# Patient Record
Sex: Female | Born: 1985 | Race: Black or African American | Hispanic: No | Marital: Single | Smoking: Never smoker
Health system: Southern US, Community
[De-identification: ages and names within clinical notes are randomized; demographics above are authoritative.]

## PROBLEM LIST (undated history)

## (undated) ENCOUNTER — Inpatient Hospital Stay (HOSPITAL_COMMUNITY): Payer: Self-pay

## (undated) DIAGNOSIS — Z98891 History of uterine scar from previous surgery: Secondary | ICD-10-CM

## (undated) DIAGNOSIS — J309 Allergic rhinitis, unspecified: Secondary | ICD-10-CM

## (undated) DIAGNOSIS — Z3490 Encounter for supervision of normal pregnancy, unspecified, unspecified trimester: Secondary | ICD-10-CM

## (undated) DIAGNOSIS — O411290 Chorioamnionitis, unspecified trimester, not applicable or unspecified: Secondary | ICD-10-CM

## (undated) DIAGNOSIS — D573 Sickle-cell trait: Secondary | ICD-10-CM

## (undated) DIAGNOSIS — D649 Anemia, unspecified: Secondary | ICD-10-CM

## (undated) HISTORY — DX: Allergic rhinitis, unspecified: J30.9

---

## 2011-12-07 ENCOUNTER — Encounter (HOSPITAL_COMMUNITY): Payer: Self-pay

## 2011-12-07 ENCOUNTER — Emergency Department (INDEPENDENT_AMBULATORY_CARE_PROVIDER_SITE_OTHER)
Admission: EM | Admit: 2011-12-07 | Discharge: 2011-12-07 | Disposition: A | Discharge status: Home / Self Care | Source: Home / Self Care | Attending: Emergency Medicine | Admitting: Emergency Medicine

## 2011-12-07 DIAGNOSIS — L708 Other acne: Secondary | ICD-10-CM

## 2011-12-07 DIAGNOSIS — L709 Acne, unspecified: Secondary | ICD-10-CM

## 2011-12-07 HISTORY — DX: Sickle-cell trait: D57.3

## 2011-12-07 HISTORY — DX: Anemia, unspecified: D64.9

## 2011-12-07 MED ORDER — CLINDAMYCIN PHOS-BENZOYL PEROX 1-5 % EX GEL: Freq: Two times a day (BID) | CUTANEOUS | Status: DC

## 2011-12-07 NOTE — ED Provider Notes (Signed)
History     CSN: [id number]  Arrival date & time 12/07/11  1525   First MD Initiated Contact with Patient 12/07/11 1547      Chief Complaint  Patient presents with  . Allergic Reaction    rash on face    (Consider location/radiation/quality/duration/timing/severity/associated sxs/prior treatment) HPI Comments: Pt with fine, itchy, pustular rash over face starting yesterday. Denies rash anywhere else. Took benadryl with some relief. No known aggravating factors. No new lotions, soaps, detergents, sun exposure, spa treatments, makeup, known contact with poison ivy. No known contacts with similar rash. States she ate some fish yesterday but that it was not different than usual. She does not take any medications on a regular basis. No angioedema, sensation of throat swelling shut, wheezing, SOB, abd pain, N/v/diarrhea.   ROS as noted in HPI. All other ROS negative.   Patient is a 26 y.o. female presenting with allergic reaction.  Allergic Reaction    Past Medical History  Diagnosis Date  . Sickle cell trait   . Anemia     Past Surgical History  Procedure Date  . Cesarean section     History reviewed. No pertinent family history.  History  Substance Use Topics  . Smoking status: Never Smoker   . Smokeless tobacco: Not on file  . Alcohol Use: Yes    OB History    Grav Para Term Preterm Abortions TAB SAB Ect Mult Living                  Review of Systems  Allergies  Shrimp  Home Medications   Current Outpatient Rx  Name Route Sig Dispense Refill  . DIPHENHYDRAMINE HCL 25 MG PO TABS Oral Take 25 mg by mouth every 6 (six) hours as needed.    Marland Kitchen CLINDAMYCIN PHOS-BENZOYL PEROX 1-5 % EX GEL Topical Apply topically 2 (two) times daily. Till symptoms resolve 25 g 2    BP 113/76  Pulse 66  Temp 98.6 F (37 C) (Oral)  Resp 18  SpO2 99%  LMP 11/20/2011  Physical Exam  Nursing note and vitals reviewed. Constitutional: She is oriented to person, place, and time.  She appears well-developed and well-nourished. No distress.  HENT:  Head: Normocephalic and atraumatic.  Mouth/Throat: Uvula is midline, oropharynx is clear and moist and mucous membranes are normal.  Eyes: Conjunctivae and EOM are normal.  Neck: Normal range of motion.  Cardiovascular: Normal rate.   Pulmonary/Chest: Effort normal.  Abdominal: She exhibits no distension.  Musculoskeletal: Normal range of motion.  Neurological: She is alert and oriented to person, place, and time. Coordination normal.  Skin: Skin is warm and dry. Rash noted.       Scattered papules, pustules, comedones of her face. There is no rash over neck, chest, back, abdomen, arms.  Psychiatric: She has a normal mood and affect. Her behavior is normal. Judgment and thought content normal.    ED Course  Procedures (including critical care time)  Labs Reviewed - No data to display No results found.   1. Acne      MDM   Benadryl for itching, and BenzaClin for mild acne. No need for aggressive treatment at this time. Referring to primary care resources for followup as needed    Luiz Blare, MD 12/07/11 2146

## 2011-12-07 NOTE — ED Notes (Signed)
Pt states she developed a rash on her face yesterday with itching, states took benadryl , itching is better but still has rash, denies difficulty breathing but states throat is a little sore.

## 2012-01-19 ENCOUNTER — Inpatient Hospital Stay (HOSPITAL_COMMUNITY)

## 2012-01-19 ENCOUNTER — Inpatient Hospital Stay (HOSPITAL_COMMUNITY)
Admission: AD | Admit: 2012-01-19 | Discharge: 2012-01-19 | Disposition: A | Discharge status: Home / Self Care | Source: Ambulatory Visit | Attending: Family Medicine | Admitting: Family Medicine

## 2012-01-19 ENCOUNTER — Encounter (HOSPITAL_COMMUNITY): Payer: Self-pay

## 2012-01-19 DIAGNOSIS — B373 Candidiasis of vulva and vagina: Secondary | ICD-10-CM

## 2012-01-19 DIAGNOSIS — N949 Unspecified condition associated with female genital organs and menstrual cycle: Secondary | ICD-10-CM | POA: Insufficient documentation

## 2012-01-19 DIAGNOSIS — L293 Anogenital pruritus, unspecified: Secondary | ICD-10-CM | POA: Insufficient documentation

## 2012-01-19 DIAGNOSIS — B3731 Acute candidiasis of vulva and vagina: Secondary | ICD-10-CM

## 2012-01-19 DIAGNOSIS — Z3490 Encounter for supervision of normal pregnancy, unspecified, unspecified trimester: Secondary | ICD-10-CM

## 2012-01-19 LAB — WET PREP, GENITAL: Trich, Wet Prep: NONE SEEN

## 2012-01-19 LAB — URINALYSIS, ROUTINE W REFLEX MICROSCOPIC
Bilirubin Urine: NEGATIVE
Ketones, ur: NEGATIVE mg/dL
Nitrite: NEGATIVE
Protein, ur: NEGATIVE mg/dL
Urobilinogen, UA: 0.2 mg/dL (ref 0.0–1.0)
pH: 7 (ref 5.0–8.0)

## 2012-01-19 LAB — URINE MICROSCOPIC-ADD ON

## 2012-01-19 MED ORDER — MICONAZOLE NITRATE 100-2 MG-% VA KIT: 1.0000 | PACK | Freq: Every day | VAGINAL | Status: DC

## 2012-01-19 NOTE — MAU Provider Note (Signed)
History     CSN: [id number]  Arrival date and time: 01/19/12 1520   First Provider Initiated Contact with Patient 01/19/12 1712      Chief Complaint  Patient presents with  . Possible Pregnancy  . Vaginal Discharge  . Breast Pain   HPI Melissa Joyce is a 26 y.o. female @ [redacted]w[redacted]d gestation who presents to MAU with vaginal discharge, and breast tenderness. Associated symptoms include amenorrhea, nausea and feeling tired. She describes the discharge as mucous. LMP 12/16/11. Last sexual intercourse last night. Last pap smear one year ago and was normal. Current sex partner x 5 years. No history of STI's. The history was provided by the patient.  OB History    Grav Para Term Preterm Abortions TAB SAB Ect Mult Living   4 1 1  2 1 1   1       Past Medical History  Diagnosis Date  . Sickle cell trait   . Anemia     Past Surgical History  Procedure Date  . Cesarean section     Family History  Problem Relation Age of Onset  . Other Neg Hx     History  Substance Use Topics  . Smoking status: Never Smoker   . Smokeless tobacco: Not on file  . Alcohol Use: Yes     wine on ocassion    Allergies:  Allergies  Allergen Reactions  . Shrimp (Shellfish Allergy) Hives    States only "steamed shrimp". Other ways of cooking shrimp are fine.     Prescriptions prior to admission  Medication Sig Dispense Refill  . clindamycin-benzoyl peroxide (BENZACLIN) gel Apply topically 2 (two) times daily. Till symptoms resolve  25 g  2  . diphenhydrAMINE (BENADRYL) 25 MG tablet Take 25 mg by mouth every 6 (six) hours as needed.        Review of Systems  Constitutional: Positive for malaise/fatigue. Negative for fever, chills and weight loss.  HENT: Negative for ear pain, nosebleeds, congestion, sore throat and neck pain.   Eyes: Negative for blurred vision, double vision, photophobia and pain.  Respiratory: Negative for cough, shortness of breath and wheezing.   Cardiovascular: Negative for  chest pain, palpitations and leg swelling.  Gastrointestinal: Positive for nausea. Negative for heartburn, vomiting, abdominal pain, diarrhea and constipation.  Genitourinary: Positive for frequency. Negative for dysuria and urgency.  Musculoskeletal: Positive for back pain. Negative for myalgias.  Skin: Negative for itching and rash.  Neurological: Negative for dizziness, sensory change, speech change, seizures, weakness and headaches.  Endo/Heme/Allergies: Does not bruise/bleed easily.  Psychiatric/Behavioral: Negative for depression. The patient is not nervous/anxious and does not have insomnia.    Physical Exam   Blood pressure 120/59, pulse 80, temperature 99.2 F (37.3 C), temperature source Oral, resp. rate 18, height 5\' 1"  (1.549 m), weight 223 lb 9.6 oz (101.424 kg), last menstrual period 12/16/2011, SpO2 99.00%.  Physical Exam  Constitutional: She is oriented to person, place, and time. She appears well-developed and well-nourished. No distress.  HENT:  Head: Normocephalic and atraumatic.  Eyes: EOM are normal.  Neck: Neck supple.  Cardiovascular: Normal rate.   Respiratory: Effort normal.  GI: Soft. There is no tenderness.  Genitourinary:       External genitalia without lesions. Thick clumpy yellow discharge vaginal vault. Cervix long, closed, no CMT, no adnexal tenderness, uterus without palpable enlargement.   Musculoskeletal: Normal range of motion.  Neurological: She is alert and oriented to person, place, and time.  Skin: Skin is  warm and dry.  Psychiatric: She has a normal mood and affect. Her behavior is normal. Judgment and thought content normal.   Results for orders placed during the hospital encounter of 01/19/12 (from the past 24 hour(s))  HCG, QUANTITATIVE, PREGNANCY     Status: Abnormal   Collection Time   01/19/12  3:33 PM      Component Value Range   hCG, Beta Chain, Quant, S 2852 (*) <5 mIU/mL  URINALYSIS, ROUTINE W REFLEX MICROSCOPIC     Status:  Abnormal   Collection Time   01/19/12  3:45 PM      Component Value Range   Color, Urine YELLOW  YELLOW   APPearance CLEAR  CLEAR   Specific Gravity, Urine 1.020  1.005 - 1.030   pH 7.0  5.0 - 8.0   Glucose, UA NEGATIVE  NEGATIVE mg/dL   Hgb urine dipstick NEGATIVE  NEGATIVE   Bilirubin Urine NEGATIVE  NEGATIVE   Ketones, ur NEGATIVE  NEGATIVE mg/dL   Protein, ur NEGATIVE  NEGATIVE mg/dL   Urobilinogen, UA 0.2  0.0 - 1.0 mg/dL   Nitrite NEGATIVE  NEGATIVE   Leukocytes, UA MODERATE (*) NEGATIVE  URINE MICROSCOPIC-ADD ON     Status: Abnormal   Collection Time   01/19/12  3:45 PM      Component Value Range   Squamous Epithelial / LPF FEW (*) RARE   WBC, UA 3-6  <3 WBC/hpf  WET PREP, GENITAL     Status: Abnormal   Collection Time   01/19/12  5:19 PM      Component Value Range   Yeast Wet Prep HPF POC MODERATE (*) NONE SEEN   Trich, Wet Prep NONE SEEN  NONE SEEN   Clue Cells Wet Prep HPF POC NONE SEEN  NONE SEEN   WBC, Wet Prep HPF POC MANY (*) NONE SEEN   Procedures US Ob Comp Less 14 Wks  01/19/2012  *RADIOLOGY REPORT*  Clinical Data: Early pregnancy with pain.  OBSTETRIC <14 WK [place] AND TRANSVAGINAL OB US  Technique:  Both transabdominal and transvaginal ultrasound examinations were performed for complete evaluation of the gestation as well as the maternal uterus, adnexal regions, and pelvic cul-de-sac.  Transvaginal technique was performed to assess early pregnancy.  Comparison:  None.  Intrauterine gestational sac:  0.54 cm gestational sac Yolk sac: Yes Embryo: No Cardiac Activity: No  MSD: 0.54 cm mm  5 w 1 d       [place] EDC: 09/19/2012  Maternal uterus/adnexae: There is no subchorionic hemorrhage.  Ovaries appear normal with a small corpus luteum cyst on the right ovary.  IMPRESSION: Single intrauterine pregnancy of approximately 5 weeks 1 day gestation.  No visualized embryo or cardiac activity at this time.   Original Report Authenticated By: Gwynn Burly, M.D.    US Ob  Transvaginal  01/19/2012  *RADIOLOGY REPORT*  Clinical Data: Early pregnancy with pain.  OBSTETRIC <14 WK [place] AND TRANSVAGINAL OB US  Technique:  Both transabdominal and transvaginal ultrasound examinations were performed for complete evaluation of the gestation as well as the maternal uterus, adnexal regions, and pelvic cul-de-sac.  Transvaginal technique was performed to assess early pregnancy.  Comparison:  None.  Intrauterine gestational sac:  0.54 cm gestational sac Yolk sac: Yes Embryo: No Cardiac Activity: No  MSD: 0.54 cm mm  5 w 1 d       [place] EDC: 09/19/2012  Maternal uterus/adnexae: There is no subchorionic hemorrhage.  Ovaries appear normal with a small  corpus luteum cyst on the right ovary.  IMPRESSION: Single intrauterine pregnancy of approximately 5 weeks 1 day gestation.  No visualized embryo or cardiac activity at this time.   Original Report Authenticated By: Gwynn Burly, M.D.     Assessment: 26 y.o. female @ 5w 1d gestation with monilia vaginosis   Right CLC   Plan:  Treat yeast   Start prenatal care and prenatal vitamins, return as needed  Discussed with the patient and all questioned fully answered. She will return if any problems arise.      Medication List     As of 01/20/2012  3:35 PM    START taking these medications         Miconazole Nitrate 100-2 MG-% Kit   Place 1 Applicatorful vaginally at bedtime.      CONTINUE taking these medications         diphenhydrAMINE 25 MG tablet   Commonly known as: BENADRYL      STOP taking these medications         clindamycin-benzoyl peroxide gel   Commonly known as: BENZACLIN          Where to get your medications    These are the prescriptions that you need to pick up.   You may get these medications from any pharmacy.         Miconazole Nitrate 100-2 MG-% Kit           Eunique Balik, RN, FNP, [facility] 01/19/2012, 6:02 PM

## 2012-01-19 NOTE — MAU Note (Signed)
Patient states she has had a yellow vaginal discharge with itching for 2 days. Has had breast tenderness for about one week. Late period.

## 2012-01-20 LAB — GC/CHLAMYDIA PROBE AMP, GENITAL
Chlamydia, DNA Probe: NEGATIVE
GC Probe Amp, Genital: NEGATIVE

## 2012-01-21 NOTE — MAU Provider Note (Signed)
Chart reviewed and agree with management and plan.  

## 2012-02-11 LAB — OB RESULTS CONSOLE ABO/RH: RH Type: POSITIVE

## 2012-02-11 LAB — OB RESULTS CONSOLE RPR: RPR: NONREACTIVE

## 2012-02-11 LAB — OB RESULTS CONSOLE HIV ANTIBODY (ROUTINE TESTING): HIV: NONREACTIVE

## 2012-02-11 LAB — OB RESULTS CONSOLE HEPATITIS B SURFACE ANTIGEN: Hepatitis B Surface Ag: NEGATIVE

## 2012-03-30 ENCOUNTER — Inpatient Hospital Stay (HOSPITAL_COMMUNITY)
Admission: AD | Admit: 2012-03-30 | Discharge: 2012-03-30 | Disposition: A | Discharge status: Home / Self Care | Source: Ambulatory Visit | Attending: Obstetrics | Admitting: Obstetrics

## 2012-03-30 ENCOUNTER — Encounter (HOSPITAL_COMMUNITY): Payer: Self-pay | Admitting: *Deleted

## 2012-03-30 DIAGNOSIS — O99891 Other specified diseases and conditions complicating pregnancy: Secondary | ICD-10-CM | POA: Insufficient documentation

## 2012-03-30 DIAGNOSIS — R109 Unspecified abdominal pain: Secondary | ICD-10-CM | POA: Insufficient documentation

## 2012-03-30 LAB — URINALYSIS, ROUTINE W REFLEX MICROSCOPIC
Bilirubin Urine: NEGATIVE
Ketones, ur: NEGATIVE mg/dL
Nitrite: NEGATIVE
Specific Gravity, Urine: 1.02 (ref 1.005–1.030)
Urobilinogen, UA: 0.2 mg/dL (ref 0.0–1.0)

## 2012-03-30 LAB — CBC
MCH: 26.3 pg (ref 26.0–34.0)
MCV: 73.9 fL — ABNORMAL LOW (ref 78.0–100.0)
Platelets: 248 10*3/uL (ref 150–400)
RDW: 16.7 % — ABNORMAL HIGH (ref 11.5–15.5)
WBC: 13.2 10*3/uL — ABNORMAL HIGH (ref 4.0–10.5)

## 2012-03-30 NOTE — MAU Provider Note (Signed)
  History     CSN: [id number]  Arrival date and time: 03/30/12 1013   First Provider Initiated Contact with Patient 03/30/12 1057      Chief Complaint  Patient presents with  . Abdominal Pain   HPI   Pt is G4P1021 [redacted]w[redacted]d who presents with bilateral lower abdominal pain since last night about 11:30pm.  The pain lasted all night.  The pain comes and goes.  Pt denies pain with urination, constipation, diarrhea, fever, chills, nausea or vomiting.  She last ate 11 pm and has not had anything to drink since last night.  She denies spotting, bleeding or loss of fluid or vaginal discharge. She has not taken anything for the pian.  Pt did not call the provider before coming the the hospital. Pt is a pt of Dr. Cherly Hensen- saw Oct 14 and has an appointment next Tues.  She has not had any problems with this pregnancy.  Past Medical History  Diagnosis Date  . Sickle cell trait   . Anemia     Past Surgical History  Procedure Date  . Cesarean section     Family History  Problem Relation Age of Onset  . Other Neg Hx     History  Substance Use Topics  . Smoking status: Never Smoker   . Smokeless tobacco: Not on file  . Alcohol Use: Yes     Comment: wine on ocassion    Allergies:  Allergies  Allergen Reactions  . Shrimp (Shellfish Allergy) Hives    States only "steamed shrimp". Other ways of cooking shrimp are fine.     Prescriptions prior to admission  Medication Sig Dispense Refill  . IRON PO Take 1 tablet by mouth every morning.      . Prenatal Vit-Fe Fumarate-FA (PRENATAL MULTIVITAMIN) TABS Take 1 tablet by mouth daily.        ROS Physical Exam   Blood pressure 124/61, pulse 76, temperature 98.2 F (36.8 C), temperature source Oral, resp. rate 20, height 5' 1.5" (1.562 m), weight 234 lb 6.4 oz (106.323 kg), last menstrual period 12/16/2011.  Physical Exam  MAU Course  Procedures     Assessment and Plan    Nadir Vasques 03/30/2012, 10:57 AM

## 2012-03-30 NOTE — H&P (Signed)
CC: abdominal pain  HPI: 26 yo G4 P1021 at weeks presents with bilateral lower quadrant pain right greater than left since last night at 11 PM. Patient stated last night pain was cyclic coming and going. Pain improved overnight and patient was able to sleep however she did wake up a couple times with twinges of pain. Upon awaking this morning the pain returned and patient decided to present to MAU for evaluation. Patient notes no nausea, tolerating regular food. Patient notes normal bowel movement yesterday with no constipation. Patient notes no vaginal bleeding. Patient does have a prior cesarean section. Patient does have a LEEP after her last delivery. Patient notes no increase in vaginal discharge. Patient notes no dyspareunia.   patient mostly concerned about the viability of the pregnancy.  Past medical history: Denies Past surgical history: Cesarean section, LEEP Medications: Prenatal vitamin Allergies: No known drug allergies  Physical exam Filed Vitals:   03/30/12 1019 03/30/12 1028  BP:  124/61  Pulse:  76  Temp:  98.2 F (36.8 C)  TempSrc:  Oral  Resp:  20  Height: 5' 1.5" (1.562 m)   Weight: 106.323 kg (234 lb 6.4 oz)    General: Well-appearing, in no distress Cardiovascular: Regular rate and rhythm, no murmurs Pulmonary: Clear to auscultation bilaterally Abdomen: Obese, no right upper quadrant pain, soft, no distention, no rebound, no guarding, nonsurgical abdomen, minimal tenderness in right lower cautery deep palpation, no masses palpated however exam limited secondary to patient's body habitus GU: Normal vagina no blood, no discharge, cervix with scar from prior LEEP, long and closed, no cervical motion tenderness, no specific fundal tenderness although fundus is difficult to discern due to body habitus, right lower quadrant tenderness noted, though this is minimal. Nonsurgical. No left lower cautery tenderness, no masses Lower extremities: Nontender, no edema  CBC      Component Value Date/Time   WBC 13.2* 03/30/2012 1055   RBC 4.37 03/30/2012 1055   HGB 11.5* 03/30/2012 1055   HCT 32.3* 03/30/2012 1055   PLT 248 03/30/2012 1055   MCV 73.9* 03/30/2012 1055   MCH 26.3 03/30/2012 1055   MCHC 35.6 03/30/2012 1055   RDW 16.7* 03/30/2012 1055     Urinalysis: Normal Gonorrhea and chlamydia: Pending Urine culture: Pending Wet prep: No yeast, no BV, few white blood cells  Assessment and plan: 26 year old G4 P1 0-1 at [redacted] weeks pregnant with right lower cautery pain of unclear etiology with overall normal vital signs, normal labs, and benign exam. Active fetal heart. The patient has history of LEEP no evidence of cervical incompetence. Discussed with patient most worrisome diagnoses are cervical incompetence, preterm labor, appendicitis and she does not show any symptoms of any of these. We discussed more common diagnoses include constipation and recommended an aggressive bowel regimen. We also discussed round ligament pain possibly worsened by her prior surgical history. At this point patient has no concerning factors for inpatient admission or urgent surgical intervention. We'll keep her planned followup outpatient appointment in one week. Recommends Tylenol, bowel regimen, heat as needed for discomfort.  Aleric Froelich A. 03/30/2012 12:41 PM

## 2012-03-30 NOTE — MAU Note (Signed)
RLQ pain since last night, sharp, intermittent.  Denies N/V/D.  No vaginal bleeding.  Pain is worse with movement & walking.

## 2012-03-31 LAB — GC/CHLAMYDIA PROBE AMP, GENITAL: Chlamydia, DNA Probe: NEGATIVE

## 2012-07-31 ENCOUNTER — Encounter (HOSPITAL_COMMUNITY): Payer: Self-pay | Admitting: *Deleted

## 2012-07-31 ENCOUNTER — Inpatient Hospital Stay (HOSPITAL_COMMUNITY)
Admission: AD | Admit: 2012-07-31 | Discharge: 2012-07-31 | Disposition: A | Discharge status: Home / Self Care | Source: Ambulatory Visit | Attending: Obstetrics and Gynecology | Admitting: Obstetrics and Gynecology

## 2012-07-31 DIAGNOSIS — O479 False labor, unspecified: Secondary | ICD-10-CM

## 2012-07-31 DIAGNOSIS — D573 Sickle-cell trait: Secondary | ICD-10-CM | POA: Insufficient documentation

## 2012-07-31 DIAGNOSIS — O4700 False labor before 37 completed weeks of gestation, unspecified trimester: Secondary | ICD-10-CM | POA: Insufficient documentation

## 2012-07-31 DIAGNOSIS — O99019 Anemia complicating pregnancy, unspecified trimester: Secondary | ICD-10-CM | POA: Insufficient documentation

## 2012-07-31 NOTE — MAU Note (Signed)
PT SAYS  IS HAVING UC Q 10 MIN.- STARTED AT 4PM.   DID NOT CALL HER DR.   DENIES HSV AND MRSA.  LAST SEX- THIS AM.

## 2012-07-31 NOTE — MAU Provider Note (Signed)
  History     CSN: [id number]  Arrival date and time: 07/31/12 1743  No chief complaint on file.  HPI Contractions every 10-15 minutes since having sex this am 0400 No bleeding - no spotting - no discharge   Past Medical History  Diagnosis Date  . Sickle cell trait   . Anemia     Past Surgical History  Procedure Laterality Date  . Cesarean section      Family History  Problem Relation Age of Onset  . Other Neg Hx     History  Substance Use Topics  . Smoking status: Never Smoker   . Smokeless tobacco: Not on file  . Alcohol Use: Yes     Comment: wine on ocassion    Allergies:  Allergies  Allergen Reactions  . Shrimp (Shellfish Allergy) Hives    States only "steamed shrimp". Other ways of cooking shrimp are fine.     Prescriptions prior to admission  Medication Sig Dispense Refill  . IRON PO Take 1 tablet by mouth every morning.      . Prenatal Vit-Fe Fumarate-FA (PRENATAL MULTIVITAMIN) TABS Take 1 tablet by mouth daily.        ROS Physical Exam   Blood pressure 121/72, pulse 88, temperature 98.1 F (36.7 C), temperature source Oral, resp. rate 20, height 5\' 1"  (1.549 m), weight 107.956 kg (238 lb), last menstrual period 12/16/2011.  Physical Exam Alert and oriented Abdomen soft and non-tender VE: closed / thick / vtx out of pelvis - ballotable extremities - no edema  MAU Course  Procedures  FHR category 1 UI with occasional ctx  Assessment and Plan  32 weeks - braxton-hicks ctx s/p intercourse No evidence of PTL  1) discharge to home 2) pelvic rest - no sex until next ROB visit (Wednesday) 3) rest - hydration - tub soak before HS tonight 4) ok benadryl 25 mg for sleep   Marlinda Mike 07/31/2012, 6:33 PM

## 2012-07-31 NOTE — MAU Note (Signed)
Wiliam Ke CNM at the bedside.

## 2012-09-19 ENCOUNTER — Inpatient Hospital Stay (HOSPITAL_COMMUNITY)
Admission: AD | Admit: 2012-09-19 | Discharge: 2012-09-20 | Disposition: A | Discharge status: Home / Self Care | Source: Ambulatory Visit | Attending: Obstetrics and Gynecology | Admitting: Obstetrics and Gynecology

## 2012-09-19 ENCOUNTER — Encounter (HOSPITAL_COMMUNITY): Payer: Self-pay | Admitting: *Deleted

## 2012-09-19 DIAGNOSIS — O479 False labor, unspecified: Secondary | ICD-10-CM | POA: Insufficient documentation

## 2012-09-19 NOTE — MAU Note (Signed)
Pt states contractions are closer and harder 

## 2012-09-21 ENCOUNTER — Other Ambulatory Visit: Payer: Self-pay | Admitting: Obstetrics and Gynecology

## 2012-09-22 ENCOUNTER — Encounter (HOSPITAL_COMMUNITY): Payer: Self-pay | Admitting: Anesthesiology

## 2012-09-22 ENCOUNTER — Encounter (HOSPITAL_COMMUNITY)
Admission: AD | Disposition: A | Discharge status: Home / Self Care | Payer: Self-pay | Source: Ambulatory Visit | Attending: Obstetrics and Gynecology

## 2012-09-22 ENCOUNTER — Inpatient Hospital Stay (HOSPITAL_COMMUNITY)
Admission: AD | Admit: 2012-09-22 | Discharge: 2012-09-25 | DRG: 765 | Disposition: A | Discharge status: Home / Self Care | Source: Ambulatory Visit | Attending: Obstetrics and Gynecology | Admitting: Obstetrics and Gynecology

## 2012-09-22 ENCOUNTER — Encounter (HOSPITAL_COMMUNITY): Payer: Self-pay | Admitting: *Deleted

## 2012-09-22 ENCOUNTER — Inpatient Hospital Stay (HOSPITAL_COMMUNITY): Admitting: Anesthesiology

## 2012-09-22 DIAGNOSIS — D62 Acute posthemorrhagic anemia: Secondary | ICD-10-CM | POA: Diagnosis not present

## 2012-09-22 DIAGNOSIS — O9903 Anemia complicating the puerperium: Secondary | ICD-10-CM | POA: Diagnosis not present

## 2012-09-22 DIAGNOSIS — O34219 Maternal care for unspecified type scar from previous cesarean delivery: Principal | ICD-10-CM | POA: Diagnosis present

## 2012-09-22 DIAGNOSIS — O324XX0 Maternal care for high head at term, not applicable or unspecified: Secondary | ICD-10-CM | POA: Diagnosis present

## 2012-09-22 DIAGNOSIS — O99892 Other specified diseases and conditions complicating childbirth: Secondary | ICD-10-CM | POA: Diagnosis present

## 2012-09-22 DIAGNOSIS — Z22330 Carrier of Group B streptococcus: Secondary | ICD-10-CM

## 2012-09-22 DIAGNOSIS — O411090 Infection of amniotic sac and membranes, unspecified, unspecified trimester, not applicable or unspecified: Secondary | ICD-10-CM | POA: Diagnosis present

## 2012-09-22 DIAGNOSIS — O328XX0 Maternal care for other malpresentation of fetus, not applicable or unspecified: Secondary | ICD-10-CM | POA: Diagnosis present

## 2012-09-22 DIAGNOSIS — O411290 Chorioamnionitis, unspecified trimester, not applicable or unspecified: Secondary | ICD-10-CM

## 2012-09-22 DIAGNOSIS — Z98891 History of uterine scar from previous surgery: Secondary | ICD-10-CM

## 2012-09-22 HISTORY — DX: Chorioamnionitis, unspecified trimester, not applicable or unspecified: O41.1290

## 2012-09-22 HISTORY — DX: History of uterine scar from previous surgery: Z98.891

## 2012-09-22 LAB — CBC
HCT: 33.3 % — ABNORMAL LOW (ref 36.0–46.0)
MCH: 25.7 pg — ABNORMAL LOW (ref 26.0–34.0)
MCHC: 34.8 g/dL (ref 30.0–36.0)
MCV: 73.7 fL — ABNORMAL LOW (ref 78.0–100.0)
RDW: 16.4 % — ABNORMAL HIGH (ref 11.5–15.5)

## 2012-09-22 LAB — TYPE AND SCREEN
ABO/RH(D): O POS
Antibody Screen: NEGATIVE

## 2012-09-22 SURGERY — Surgical Case
Anesthesia: Epidural | Site: Abdomen | Wound class: Clean Contaminated

## 2012-09-22 MED ORDER — PHENYLEPHRINE 40 MCG/ML (10ML) SYRINGE FOR IV PUSH (FOR BLOOD PRESSURE SUPPORT): 80.0000 ug | PREFILLED_SYRINGE | INTRAVENOUS | Status: DC | PRN

## 2012-09-22 MED ORDER — ONDANSETRON HCL 4 MG/2ML IJ SOLN: 4.0000 mg | Freq: Four times a day (QID) | INTRAMUSCULAR | Status: DC | PRN

## 2012-09-22 MED ORDER — SODIUM CHLORIDE 0.9 % IJ SOLN
3.0000 mL | Freq: Two times a day (BID) | INTRAMUSCULAR | Status: DC
Start: 2012-09-23 — End: 2012-09-25

## 2012-09-22 MED ORDER — OXYTOCIN 40 UNITS IN LACTATED RINGERS INFUSION - SIMPLE MED
62.5000 mL/h | INTRAVENOUS | Status: DC
  Filled 2012-09-22: qty 1000

## 2012-09-22 MED ORDER — IBUPROFEN 600 MG PO TABS
600.0000 mg | ORAL_TABLET | Freq: Four times a day (QID) | ORAL | Status: DC
  Administered 2012-09-23 – 2012-09-25 (×9): 600 mg via ORAL
  Filled 2012-09-22 (×9): qty 1

## 2012-09-22 MED ORDER — SIMETHICONE 80 MG PO CHEW: 80.0000 mg | CHEWABLE_TABLET | ORAL | Status: DC | PRN

## 2012-09-22 MED ORDER — OXYTOCIN 40 UNITS IN LACTATED RINGERS INFUSION - SIMPLE MED: 62.5000 mL/h | INTRAVENOUS | Status: AC

## 2012-09-22 MED ORDER — FERROUS SULFATE 325 (65 FE) MG PO TABS
325.0000 mg | ORAL_TABLET | Freq: Two times a day (BID) | ORAL | Status: DC
  Administered 2012-09-23 – 2012-09-25 (×5): 325 mg via ORAL
  Filled 2012-09-22 (×4): qty 1

## 2012-09-22 MED ORDER — METHYLERGONOVINE MALEATE 0.2 MG PO TABS: 0.2000 mg | ORAL_TABLET | ORAL | Status: DC | PRN

## 2012-09-22 MED ORDER — PENICILLIN G POTASSIUM 5000000 UNITS IJ SOLR
5.0000 10*6.[IU] | Freq: Once | INTRAMUSCULAR | Status: AC
  Administered 2012-09-22: 5 10*6.[IU] via INTRAVENOUS
  Filled 2012-09-22: qty 5

## 2012-09-22 MED ORDER — SIMETHICONE 80 MG PO CHEW
80.0000 mg | CHEWABLE_TABLET | Freq: Three times a day (TID) | ORAL | Status: DC
  Administered 2012-09-23 – 2012-09-25 (×8): 80 mg via ORAL

## 2012-09-22 MED ORDER — LIDOCAINE HCL (PF) 1 % IJ SOLN: 30.0000 mL | INTRAMUSCULAR | Status: DC | PRN

## 2012-09-22 MED ORDER — BUPIVACAINE HCL (PF) 0.25 % IJ SOLN
INTRAMUSCULAR | Status: DC | PRN
  Administered 2012-09-22: 6 mL

## 2012-09-22 MED ORDER — EPHEDRINE 5 MG/ML INJ
10.0000 mg | INTRAVENOUS | Status: DC | PRN
  Filled 2012-09-22: qty 4

## 2012-09-22 MED ORDER — FLEET ENEMA 7-19 GM/118ML RE ENEM: 1.0000 | ENEMA | Freq: Every day | RECTAL | Status: DC | PRN

## 2012-09-22 MED ORDER — MEPERIDINE HCL 25 MG/ML IJ SOLN: 6.2500 mg | INTRAMUSCULAR | Status: DC | PRN

## 2012-09-22 MED ORDER — LANOLIN HYDROUS EX OINT: 1.0000 "application " | TOPICAL_OINTMENT | CUTANEOUS | Status: DC | PRN

## 2012-09-22 MED ORDER — NALOXONE HCL 1 MG/ML IJ SOLN: 1.0000 ug/kg/h | INTRAVENOUS | Status: DC | PRN

## 2012-09-22 MED ORDER — ACETAMINOPHEN 500 MG PO TABS: 1000.0000 mg | ORAL_TABLET | ORAL | Status: DC | PRN

## 2012-09-22 MED ORDER — PHENYLEPHRINE 40 MCG/ML (10ML) SYRINGE FOR IV PUSH (FOR BLOOD PRESSURE SUPPORT)
80.0000 ug | PREFILLED_SYRINGE | INTRAVENOUS | Status: DC | PRN
  Filled 2012-09-22 (×2): qty 5

## 2012-09-22 MED ORDER — PRENATAL MULTIVITAMIN CH
1.0000 | ORAL_TABLET | Freq: Every day | ORAL | Status: DC
  Filled 2012-09-22 (×2): qty 1

## 2012-09-22 MED ORDER — LIDOCAINE-EPINEPHRINE (PF) 2 %-1:200000 IJ SOLN
INTRAMUSCULAR | Status: AC
  Filled 2012-09-22: qty 20

## 2012-09-22 MED ORDER — GENTAMICIN SULFATE 40 MG/ML IJ SOLN
Freq: Once | INTRAVENOUS | Status: AC
  Administered 2012-09-22: 115.25 mL via INTRAVENOUS
  Filled 2012-09-22: qty 9.25

## 2012-09-22 MED ORDER — DIPHENHYDRAMINE HCL 25 MG PO CAPS
25.0000 mg | ORAL_CAPSULE | ORAL | Status: DC | PRN
  Administered 2012-09-23: 25 mg via ORAL
  Filled 2012-09-22: qty 1

## 2012-09-22 MED ORDER — OXYCODONE-ACETAMINOPHEN 5-325 MG PO TABS
1.0000 | ORAL_TABLET | ORAL | Status: DC | PRN
  Administered 2012-09-23: 2 via ORAL
  Administered 2012-09-24 – 2012-09-25 (×5): 1 via ORAL
  Filled 2012-09-22 (×3): qty 1
  Filled 2012-09-22: qty 2
  Filled 2012-09-22 (×2): qty 1

## 2012-09-22 MED ORDER — SENNOSIDES-DOCUSATE SODIUM 8.6-50 MG PO TABS
2.0000 | ORAL_TABLET | Freq: Every day | ORAL | Status: DC
Start: 2012-09-22 — End: 2012-09-25
  Administered 2012-09-23 – 2012-09-24 (×2): 2 via ORAL

## 2012-09-22 MED ORDER — LACTATED RINGERS IV SOLN
500.0000 mL | INTRAVENOUS | Status: DC | PRN
  Administered 2012-09-22 (×2): 500 mL via INTRAVENOUS

## 2012-09-22 MED ORDER — LACTATED RINGERS IV SOLN
INTRAVENOUS | Status: DC
  Administered 2012-09-22: 125 mL/h via INTRAVENOUS
  Administered 2012-09-22 (×3): via INTRAVENOUS

## 2012-09-22 MED ORDER — METHYLERGONOVINE MALEATE 0.2 MG/ML IJ SOLN: 0.2000 mg | INTRAMUSCULAR | Status: DC | PRN

## 2012-09-22 MED ORDER — ONDANSETRON HCL 4 MG/2ML IJ SOLN
INTRAMUSCULAR | Status: DC | PRN
  Administered 2012-09-22: 4 mg via INTRAVENOUS

## 2012-09-22 MED ORDER — CLINDAMYCIN PHOSPHATE 900 MG/50ML IV SOLN
900.0000 mg | Freq: Three times a day (TID) | INTRAVENOUS | Status: DC
  Administered 2012-09-23 (×2): 900 mg via INTRAVENOUS
  Filled 2012-09-22 (×4): qty 50

## 2012-09-22 MED ORDER — SODIUM BICARBONATE 8.4 % IV SOLN
INTRAVENOUS | Status: AC
  Filled 2012-09-22: qty 50

## 2012-09-22 MED ORDER — CITRIC ACID-SODIUM CITRATE 334-500 MG/5ML PO SOLN
30.0000 mL | ORAL | Status: DC | PRN
  Filled 2012-09-22: qty 15

## 2012-09-22 MED ORDER — FLEET ENEMA 7-19 GM/118ML RE ENEM: 1.0000 | ENEMA | RECTAL | Status: DC | PRN

## 2012-09-22 MED ORDER — DIPHENHYDRAMINE HCL 50 MG/ML IJ SOLN: 12.5000 mg | INTRAMUSCULAR | Status: DC | PRN

## 2012-09-22 MED ORDER — NALBUPHINE HCL 10 MG/ML IJ SOLN: 5.0000 mg | INTRAMUSCULAR | Status: DC | PRN

## 2012-09-22 MED ORDER — PENICILLIN G POTASSIUM 5000000 UNITS IJ SOLR
2.5000 10*6.[IU] | INTRAVENOUS | Status: DC
  Administered 2012-09-22: 2.5 10*6.[IU] via INTRAVENOUS
  Filled 2012-09-22 (×5): qty 2.5

## 2012-09-22 MED ORDER — ONDANSETRON HCL 4 MG/2ML IJ SOLN
INTRAMUSCULAR | Status: AC
  Filled 2012-09-22: qty 2

## 2012-09-22 MED ORDER — LIDOCAINE HCL (PF) 1 % IJ SOLN
INTRAMUSCULAR | Status: DC | PRN
Start: 2012-09-22 — End: 2012-09-22
  Administered 2012-09-22 (×2): 5 mL

## 2012-09-22 MED ORDER — WITCH HAZEL-GLYCERIN EX PADS: 1.0000 "application " | MEDICATED_PAD | CUTANEOUS | Status: DC | PRN

## 2012-09-22 MED ORDER — TERBUTALINE SULFATE 1 MG/ML IJ SOLN: 0.2500 mg | Freq: Once | INTRAMUSCULAR | Status: DC | PRN

## 2012-09-22 MED ORDER — SCOPOLAMINE 1 MG/3DAYS TD PT72: 1.0000 | MEDICATED_PATCH | Freq: Once | TRANSDERMAL | Status: DC

## 2012-09-22 MED ORDER — DIPHENHYDRAMINE HCL 50 MG/ML IJ SOLN: 25.0000 mg | INTRAMUSCULAR | Status: DC | PRN

## 2012-09-22 MED ORDER — NALOXONE HCL 0.4 MG/ML IJ SOLN: 0.4000 mg | INTRAMUSCULAR | Status: DC | PRN

## 2012-09-22 MED ORDER — OXYTOCIN 40 UNITS IN LACTATED RINGERS INFUSION - SIMPLE MED
1.0000 m[IU]/min | INTRAVENOUS | Status: DC
  Administered 2012-09-22: 2 m[IU]/min via INTRAVENOUS

## 2012-09-22 MED ORDER — METOCLOPRAMIDE HCL 5 MG/ML IJ SOLN: 10.0000 mg | Freq: Three times a day (TID) | INTRAMUSCULAR | Status: DC | PRN

## 2012-09-22 MED ORDER — NALBUPHINE HCL 10 MG/ML IJ SOLN
5.0000 mg | INTRAMUSCULAR | Status: DC | PRN
  Administered 2012-09-22: 10 mg via SUBCUTANEOUS
  Filled 2012-09-22: qty 1

## 2012-09-22 MED ORDER — SODIUM CHLORIDE 0.9 % IV SOLN: 250.0000 mL | INTRAVENOUS | Status: DC

## 2012-09-22 MED ORDER — ACETAMINOPHEN 325 MG PO TABS
650.0000 mg | ORAL_TABLET | ORAL | Status: DC | PRN
  Administered 2012-09-22: 650 mg via ORAL
  Administered 2012-09-22: 325 mg via ORAL
  Filled 2012-09-22: qty 1
  Filled 2012-09-22: qty 2

## 2012-09-22 MED ORDER — SODIUM CHLORIDE 0.9 % IJ SOLN: 3.0000 mL | INTRAMUSCULAR | Status: DC | PRN

## 2012-09-22 MED ORDER — AMPICILLIN-SULBACTAM SODIUM 3 (2-1) G IJ SOLR
3.0000 g | Freq: Four times a day (QID) | INTRAMUSCULAR | Status: DC
  Administered 2012-09-22 – 2012-09-23 (×5): 3 g via INTRAVENOUS
  Filled 2012-09-22 (×7): qty 3

## 2012-09-22 MED ORDER — KETOROLAC TROMETHAMINE 30 MG/ML IJ SOLN
30.0000 mg | Freq: Four times a day (QID) | INTRAMUSCULAR | Status: AC | PRN
  Filled 2012-09-22: qty 1

## 2012-09-22 MED ORDER — ONDANSETRON HCL 4 MG PO TABS: 4.0000 mg | ORAL_TABLET | ORAL | Status: DC | PRN

## 2012-09-22 MED ORDER — SODIUM BICARBONATE 8.4 % IV SOLN
INTRAVENOUS | Status: DC | PRN
  Administered 2012-09-22: 10 mL via EPIDURAL

## 2012-09-22 MED ORDER — IBUPROFEN 600 MG PO TABS: 600.0000 mg | ORAL_TABLET | Freq: Four times a day (QID) | ORAL | Status: DC | PRN

## 2012-09-22 MED ORDER — OXYTOCIN 10 UNIT/ML IJ SOLN
40.0000 [IU] | INTRAVENOUS | Status: DC | PRN
  Administered 2012-09-22: 40 [IU] via INTRAVENOUS

## 2012-09-22 MED ORDER — DIPHENHYDRAMINE HCL 25 MG PO CAPS: 25.0000 mg | ORAL_CAPSULE | Freq: Four times a day (QID) | ORAL | Status: DC | PRN

## 2012-09-22 MED ORDER — LACTATED RINGERS IV SOLN
500.0000 mL | Freq: Once | INTRAVENOUS | Status: AC
Start: 2012-09-22 — End: 2012-09-22
  Administered 2012-09-22: 500 mL via INTRAVENOUS

## 2012-09-22 MED ORDER — OXYCODONE-ACETAMINOPHEN 5-325 MG PO TABS: 1.0000 | ORAL_TABLET | ORAL | Status: DC | PRN

## 2012-09-22 MED ORDER — FENTANYL CITRATE 0.05 MG/ML IJ SOLN: 25.0000 ug | INTRAMUSCULAR | Status: DC | PRN

## 2012-09-22 MED ORDER — KETOROLAC TROMETHAMINE 30 MG/ML IJ SOLN
INTRAMUSCULAR | Status: AC
  Administered 2012-09-22: 30 mg via INTRAVENOUS
  Filled 2012-09-22: qty 1

## 2012-09-22 MED ORDER — MORPHINE SULFATE (PF) 0.5 MG/ML IJ SOLN
INTRAMUSCULAR | Status: DC | PRN
  Administered 2012-09-22: 4 mg via EPIDURAL
  Administered 2012-09-22: 1 mg via INTRAVENOUS

## 2012-09-22 MED ORDER — BISACODYL 10 MG RE SUPP: 10.0000 mg | Freq: Every day | RECTAL | Status: DC | PRN

## 2012-09-22 MED ORDER — MORPHINE SULFATE 0.5 MG/ML IJ SOLN
INTRAMUSCULAR | Status: AC
  Filled 2012-09-22: qty 10

## 2012-09-22 MED ORDER — FENTANYL 2.5 MCG/ML BUPIVACAINE 1/10 % EPIDURAL INFUSION (WH - ANES)
14.0000 mL/h | INTRAMUSCULAR | Status: DC | PRN
  Administered 2012-09-22 (×3): 14 mL/h via EPIDURAL
  Filled 2012-09-22 (×3): qty 125

## 2012-09-22 MED ORDER — LACTATED RINGERS IV SOLN
INTRAVENOUS | Status: DC | PRN
  Administered 2012-09-22 (×2): via INTRAVENOUS

## 2012-09-22 MED ORDER — OXYTOCIN BOLUS FROM INFUSION: 500.0000 mL | INTRAVENOUS | Status: DC

## 2012-09-22 MED ORDER — MENTHOL 3 MG MT LOZG: 1.0000 | LOZENGE | OROMUCOSAL | Status: DC | PRN

## 2012-09-22 MED ORDER — ACETAMINOPHEN 10 MG/ML IV SOLN: 1000.0000 mg | Freq: Four times a day (QID) | INTRAVENOUS | Status: AC | PRN

## 2012-09-22 MED ORDER — CLINDAMYCIN PHOSPHATE 900 MG/50ML IV SOLN: 900.0000 mg | Freq: Three times a day (TID) | INTRAVENOUS | Status: DC

## 2012-09-22 MED ORDER — OXYTOCIN 10 UNIT/ML IJ SOLN
INTRAMUSCULAR | Status: AC
  Filled 2012-09-22: qty 4

## 2012-09-22 MED ORDER — ONDANSETRON HCL 4 MG/2ML IJ SOLN: 4.0000 mg | INTRAMUSCULAR | Status: DC | PRN

## 2012-09-22 MED ORDER — ONDANSETRON HCL 4 MG/2ML IJ SOLN
4.0000 mg | Freq: Three times a day (TID) | INTRAMUSCULAR | Status: DC | PRN
Start: 2012-09-22 — End: 2012-09-25

## 2012-09-22 MED ORDER — DIBUCAINE 1 % RE OINT: 1.0000 "application " | TOPICAL_OINTMENT | RECTAL | Status: DC | PRN

## 2012-09-22 MED ORDER — ZOLPIDEM TARTRATE 5 MG PO TABS: 5.0000 mg | ORAL_TABLET | Freq: Every evening | ORAL | Status: DC | PRN

## 2012-09-22 MED ORDER — SCOPOLAMINE 1 MG/3DAYS TD PT72
MEDICATED_PATCH | TRANSDERMAL | Status: AC
  Administered 2012-09-22: 1.5 mg via TRANSDERMAL
  Filled 2012-09-22: qty 1

## 2012-09-22 MED ORDER — KETOROLAC TROMETHAMINE 30 MG/ML IJ SOLN
30.0000 mg | Freq: Four times a day (QID) | INTRAMUSCULAR | Status: AC | PRN
  Administered 2012-09-23: 30 mg via INTRAVENOUS

## 2012-09-22 MED ORDER — GUAIFENESIN 100 MG/5ML PO SYRP
200.0000 mg | ORAL_SOLUTION | ORAL | Status: DC | PRN
  Administered 2012-09-22: 200 mg via ORAL
  Filled 2012-09-22: qty 118

## 2012-09-22 SURGICAL SUPPLY — 38 items
BARRIER ADHS 3X4 INTERCEED (GAUZE/BANDAGES/DRESSINGS) ×2 IMPLANT
BENZOIN TINCTURE PRP APPL 2/3 (GAUZE/BANDAGES/DRESSINGS) IMPLANT
CLOTH BEACON ORANGE TIMEOUT ST (SAFETY) ×2 IMPLANT
CONTAINER PREFILL 10% NBF 15ML (MISCELLANEOUS) IMPLANT
DRAPE LG THREE QUARTER DISP (DRAPES) ×2 IMPLANT
DRSG OPSITE POSTOP 4X10 (GAUZE/BANDAGES/DRESSINGS) ×2 IMPLANT
DURAPREP 26ML APPLICATOR (WOUND CARE) IMPLANT
ELECT REM PT RETURN 9FT ADLT (ELECTROSURGICAL) ×2
ELECTRODE REM PT RTRN 9FT ADLT (ELECTROSURGICAL) ×1 IMPLANT
EXTRACTOR VACUUM M CUP 4 TUBE (SUCTIONS) IMPLANT
GLOVE BIO SURGEON STRL SZ 6.5 (GLOVE) ×2 IMPLANT
GLOVE BIOGEL PI IND STRL 7.0 (GLOVE) ×1 IMPLANT
GLOVE BIOGEL PI INDICATOR 7.0 (GLOVE) ×1
GOWN STRL REIN XL XLG (GOWN DISPOSABLE) ×6 IMPLANT
KIT ABG SYR 3ML LUER SLIP (SYRINGE) IMPLANT
NEEDLE HYPO 25X1 1.5 SAFETY (NEEDLE) ×2 IMPLANT
NEEDLE HYPO 25X5/8 SAFETYGLIDE (NEEDLE) IMPLANT
NS IRRIG 1000ML POUR BTL (IV SOLUTION) ×2 IMPLANT
PACK C SECTION WH (CUSTOM PROCEDURE TRAY) ×2 IMPLANT
PAD OB MATERNITY 4.3X12.25 (PERSONAL CARE ITEMS) ×2 IMPLANT
RTRCTR C-SECT PINK 25CM LRG (MISCELLANEOUS) IMPLANT
STAPLER VISISTAT 35W (STAPLE) ×2 IMPLANT
STRIP CLOSURE SKIN 1/2X4 (GAUZE/BANDAGES/DRESSINGS) IMPLANT
SUT CHROMIC GUT AB #0 18 (SUTURE) IMPLANT
SUT MNCRL 0 VIOLET CTX 36 (SUTURE) ×3 IMPLANT
SUT MON AB 4-0 PS1 27 (SUTURE) IMPLANT
SUT MONOCRYL 0 CTX 36 (SUTURE) ×3
SUT PLAIN 2 0 (SUTURE)
SUT PLAIN 2 0 XLH (SUTURE) ×2 IMPLANT
SUT PLAIN ABS 2-0 CT1 27XMFL (SUTURE) IMPLANT
SUT VIC AB 0 CT1 27 (SUTURE) ×2
SUT VIC AB 0 CT1 27XBRD ANBCTR (SUTURE) ×2 IMPLANT
SUT VIC AB 2-0 CT1 27 (SUTURE) ×1
SUT VIC AB 2-0 CT1 TAPERPNT 27 (SUTURE) ×1 IMPLANT
SYR CONTROL 10ML LL (SYRINGE) ×2 IMPLANT
TOWEL OR 17X24 6PK STRL BLUE (TOWEL DISPOSABLE) ×6 IMPLANT
TRAY FOLEY CATH 14FR (SET/KITS/TRAYS/PACK) IMPLANT
WATER STERILE IRR 1000ML POUR (IV SOLUTION) ×2 IMPLANT

## 2012-09-22 NOTE — Anesthesia Procedure Notes (Signed)
Epidural Patient location during procedure: OB Start time: 09/22/2012 4:23 AM  Staffing Anesthesiologist: Angus Seller., Harrell Gave. Performed by: anesthesiologist   Preanesthetic Checklist Completed: patient identified, site marked, surgical consent, pre-op evaluation, timeout performed, IV checked, risks and benefits discussed and monitors and equipment checked  Epidural Patient position: sitting Prep: ChloraPrep and site prepped and draped Patient monitoring: continuous pulse ox and blood pressure Approach: midline Injection technique: LOR air and LOR saline  Needle:  Needle type: Tuohy  Needle gauge: 17 G Needle length: 9 cm and 9 Needle insertion depth: 6 cm Catheter type: closed end flexible Catheter size: 19 Gauge Catheter at skin depth: 12 cm Test dose: negative  Assessment Events: blood not aspirated, injection not painful, no injection resistance, negative IV test and no paresthesia  Additional Notes Patient identified.  Risk benefits discussed including failed block, incomplete pain control, headache, nerve damage, paralysis, blood pressure changes, nausea, vomiting, reactions to medication both toxic or allergic, and postpartum back pain.  Patient expressed understanding and wished to proceed.  All questions were answered.  Sterile technique used throughout procedure and epidural site dressed with sterile barrier dressing. No paresthesia or other complications noted.The patient did not experience any signs of intravascular injection such as tinnitus or metallic taste in mouth nor signs of intrathecal spread such as rapid motor block. Please see nursing notes for vital signs.

## 2012-09-22 NOTE — Progress Notes (Signed)
Melissa Joyce is a 27 y.o. G3P0011 at [redacted]w[redacted]d by ultrasound admitted for rupture of membranes  Subjective: Chief Complaint  Patient presents with  . Contractions  . Rupture of Membranes  Epidural Sleeping off effect of ambien  Objective: BP 128/72  Pulse 101  Temp(Src) 98.6 F (37 C) (Axillary)  Resp 16  Ht 5\' 2"  (1.575 m)  Wt 109.77 kg (242 lb)  BMI 44.25 kg/m2  LMP 12/16/2011      FHT:  FHR: 150 bpm, variability: moderate,  accelerations:  Present,  decelerations:  Absent UC:   irregular, every 2-4 minutes SVE:  Tight 1/60/-3 edema on right  Labs: Lab Results  Component Value Date   WBC 11.5* 09/22/2012   HGB 11.6* 09/22/2012   HCT 33.3* 09/22/2012   MCV 73.7* 09/22/2012   PLT 245 09/22/2012    Assessment / Plan: SROM VBAC GBS cx (+)  Term P) intracervical balloon placed. Start pitocin. Pt informed if no progress will need repeat C/S. Cont PCN  Anticipated MOD:  unknown  Melissa Joyce A 09/22/2012, 9:04 AM

## 2012-09-22 NOTE — Anesthesia Postprocedure Evaluation (Signed)
  Anesthesia Post Note  Patient: Melissa Joyce  Procedure(s) Performed: Procedure(s) (LRB): CESAREAN SECTION (N/A)  Anesthesia type: Epidural  Patient location: PACU  Post pain: Pain level controlled  Post assessment: Post-op Vital signs reviewed  Last Vitals:  Filed Vitals:   09/22/12 2045  BP: 123/73  Pulse: 86  Temp: 36.8 C  Resp: 16    Post vital signs: Reviewed  Level of consciousness: awake  Complications: No apparent anesthesia complications

## 2012-09-22 NOTE — Transfer of Care (Signed)
Immediate Anesthesia Transfer of Care Note  Patient: Melissa Joyce  Procedure(s) Performed: Procedure(s): CESAREAN SECTION (N/A)  Patient Location: PACU  Anesthesia Type:Epidural  Level of Consciousness: awake, alert  and oriented  Airway & Oxygen Therapy: Patient Spontanous Breathing  Post-op Assessment: Report given to PACU RN  Post vital signs: Reviewed and stable  Complications: No apparent anesthesia complications

## 2012-09-22 NOTE — MAU Note (Signed)
Patient presents with contractions and water broke about 20 minutes ago, patient taken directly to room 10 from lobby.

## 2012-09-22 NOTE — Anesthesia Preprocedure Evaluation (Signed)
Anesthesia Evaluation  Patient identified by MRN, date of birth, ID band Patient awake    Reviewed: Allergy & Precautions, H&P , Patient's Chart, lab work & pertinent test results  Airway Mallampati: III TM Distance: >3 FB Neck ROM: full    Dental no notable dental hx.    Pulmonary neg pulmonary ROS,  breath sounds clear to auscultation  Pulmonary exam normal       Cardiovascular negative cardio ROS  Rhythm:regular Rate:Normal     Neuro/Psych negative neurological ROS  negative psych ROS   GI/Hepatic negative GI ROS, Neg liver ROS,   Endo/Other  negative endocrine ROSMorbid obesity  Renal/GU negative Renal ROS     Musculoskeletal   Abdominal   Peds  Hematology negative hematology ROS (+) anemia ,   Anesthesia Other Findings   Reproductive/Obstetrics (+) Pregnancy                           Anesthesia Physical Anesthesia Plan  ASA: III  Anesthesia Plan: Epidural   Post-op Pain Management:    Induction:   Airway Management Planned:   Additional Equipment:   Intra-op Plan:   Post-operative Plan:   Informed Consent: I have reviewed the patients History and Physical, chart, labs and discussed the procedure including the risks, benefits and alternatives for the proposed anesthesia with the patient or authorized representative who has indicated his/her understanding and acceptance.     Plan Discussed with:   Anesthesia Plan Comments:         Anesthesia Quick Evaluation  

## 2012-09-22 NOTE — Progress Notes (Signed)
ANTIBIOTIC CONSULT NOTE - INITIAL  Pharmacy Consult for Gentamicin Indication: R/O Chorioamnionitis  Allergies  Allergen Reactions  . Shrimp (Shellfish Allergy) Hives    States only "steamed shrimp". Other ways of cooking shrimp are fine.     Patient Measurements: Height: 5\' 2"  (157.5 cm) Weight: 242 lb (109.77 kg) IBW/kg (Calculated) : 50.1 Adjusted Body Weight: 74kg  Vital Signs: Temp: 99.9 F (37.7 C) (05/21 2115) Temp src: Oral (05/21 1400) BP: 128/65 mmHg (05/21 2115) Pulse Rate: 92 (05/21 2115)  Labs:  Recent Labs  09/22/12 0315  WBC 11.5*  HGB 11.6*  PLT 245   CrCl is unknown because no creatinine reading has been taken.   Medical History: Past Medical History  Diagnosis Date  . Sickle cell trait   . Anemia     Medications:  Penicillin protocol for GBS + changed to Unasyn 3 gram IV q6h Clindamycin 900mg  IV q8h Assessment: 27 yo F admitted with ROM and contractions. Penicillin changed to Unasyn for r/o chorioamnionitis. Pt taken to OR for LTCS-- pre-op dose of Gentamicin and Clindamycin.  Goal of Therapy:  Pre-op dose of Gentamicin should provide adequate levels for 24 hours  Plan:  1. Gentamicin 370mg  (5mg /kg) x 1 to OR. 2. Discussed with Dr. Cherly Hensen post-op, The pre-op dose should be appropriate therapy for 24 hours. She plans to continue Clindamycin 900mg  IV q8h post-op. Thanks!  Claybon Jabs 09/22/2012,9:26 PM

## 2012-09-22 NOTE — Progress Notes (Signed)
Jalonda Antigua is a 27 y.o. G3P0011 at [redacted]w[redacted]d by ultrasound admitted for rupture of membranes  Subjective: Chief Complaint  Patient presents with  . Contractions  . Rupture of Membranes    Objective: BP 108/39  Pulse 87  Temp(Src) 101.1 F (38.4 C) (Oral)  Resp 20  Ht 5\' 2"  (1.575 m)  Wt 109.77 kg (242 lb)  BMI 44.25 kg/m2  LMP 12/16/2011    Epidural Pitocin off Unasyn  FHT:  FHR: 165 bpm, variability: minimal ,  accelerations:  Present,  decelerations:  Absent UC:   regular, every 2 minutes SVE:   5 cm dilated, 60% effaced, -2 station  ucx sent Labs: Lab Results  Component Value Date   WBC 11.5* 09/22/2012   HGB 11.6* 09/22/2012   HCT 33.3* 09/22/2012   MCV 73.7* 09/22/2012   PLT 245 09/22/2012    Assessment / Plan: Arrest in active phase of labor GBS cx (+) previous given IV PCN Presumed chorioamnionitis on Unasyn Previous C/S P) recommend repeat C/S. Risk reviewed with pt and family. Consent signed Anticipated MOD:  C/S  Nathon Stefanski A 09/22/2012, 6:09 PM

## 2012-09-22 NOTE — Brief Op Note (Signed)
09/22/2012  7:41 PM  PATIENT:  Melissa Joyce  27 y.o. female  PRE-OPERATIVE DIAGNOSIS:  arrest of dilatation, previous cesarean section, presumed chorioamnionitis  POST-OPERATIVE DIAGNOSIS:  arrest of dilatation, previous cesarean section, presumed chorioamnionitis  PROCEDURE:  Repeat Cesarean section, Sharl Ma hysterotomy  SURGEON:  Surgeon(s) and Role:    * Makayela Secrest Cathie Beams, MD - Primary  PHYSICIAN ASSISTANT:   ASSISTANTS: Alphonzo Severance, MD   ANESTHESIA:   epidural Findings: live female ROP, compound presentation, mod mec fluid, nl tubes and ovaries, well developed LUS. Apgar 9/9 EBL:  Total I/O In: 500 [I.V.:500] Out: -   BLOOD ADMINISTERED:none  DRAINS: none   LOCAL MEDICATIONS USED:  MARCAINE     SPECIMEN:  Source of Specimen:  placenta  DISPOSITION OF SPECIMEN:  PATHOLOGY  COUNTS:  YES  TOURNIQUET:  * No tourniquets in log *  DICTATION: .Other Dictation: Dictation Number [id number]  PLAN OF CARE: Admit to inpatient   PATIENT DISPOSITION:  PACU - hemodynamically stable.   Delay start of Pharmacological VTE agent (>24hrs) due to surgical blood loss or risk of bleeding: no

## 2012-09-22 NOTE — Progress Notes (Signed)
Melissa Joyce is a 27 y.o. G3P0011 at [redacted]w[redacted]d by ultrasound admitted for rupture of membranes  Subjective: Chief Complaint  Patient presents with  . Contractions  . Rupture of Membranes  epidural Pitocin Objective: BP 102/40  Pulse 92  Temp(Src) 99.2 F (37.3 C) (Oral)  Resp 18  Ht 5\' 2"  (1.575 m)  Wt 109.77 kg (242 lb)  BMI 44.25 kg/m2  LMP 12/16/2011      FHT:  FHR: 155-160 bpm, variability: minimal ,  accelerations:  Abscent,  decelerations:  Present early decels UC:   irregular, every 2-4 mins minutes SVE:  5/edematous /-3 IUPC placed Labs: Lab Results  Component Value Date   WBC 11.5* 09/22/2012   HGB 11.6* 09/22/2012   HCT 33.3* 09/22/2012   MCV 73.7* 09/22/2012   PLT 245 09/22/2012    Assessment / Plan: Arrest in active phase of labor GBS cx (+)  VBAC Term P) exaggerated sims left. Cont pitocin. IVF bolus  Anticipated MOD:  poss C/S  Melissa Joyce A 09/22/2012, 1:59 PM

## 2012-09-22 NOTE — H&P (Signed)
Melissa Joyce is a 27 y.o. female presenting @ 60 1/[redacted] weeks gestation presenting with SROM @ 1 am clear fluid. (+) FM  irreg ctx (+) GBS  Strongly desires VBAC. Previous C/S for arrest of dilation. Epidural done per Dr Ernestina Penna  Maternal Medical History:  Reason for admission: Rupture of membranes.   Contractions: Onset was more than 2 days ago.   Frequency: irregular.    Fetal activity: Perceived fetal activity is normal.    Prenatal complications: no prenatal complications Prenatal Complications - Diabetes: none.    OB History   Grav Para Term Preterm Abortions TAB SAB Ect Mult Living   3 1 0  1 1 0   1     Past Medical History  Diagnosis Date  . Sickle cell trait   . Anemia    Past Surgical History  Procedure Laterality Date  . Cesarean section     Family History: family history includes Anemia in her mother and Heart disease in her father.  There is no history of Other. Social History:  reports that she has never smoked. She has never used smokeless tobacco. She reports that  drinks alcohol. She reports that she does not use illicit drugs.   Prenatal Transfer Tool  Maternal Diabetes: No Genetic Screening: Normal Maternal Ultrasounds/Referrals: Normal Fetal Ultrasounds or other Referrals:  None Maternal Substance Abuse:  No Significant Maternal Medications:  None Significant Maternal Lab Results:  Lab values include: Group B Strep positive Other Comments:  None  ROS neg  Dilation: Fingertip Effacement (%): 50 Station: -2 Exam by:: Lucas Mallow, RN Blood pressure 128/72, pulse 101, temperature 98.6 F (37 C), temperature source Axillary, resp. rate 16, height 5\' 2"  (1.575 m), weight 109.77 kg (242 lb), last menstrual period 12/16/2011. Maternal Exam:  Uterine Assessment: Contraction strength is mild.  Contraction frequency is irregular.   Abdomen: Patient reports no abdominal tenderness. Surgical scars: low transverse.   Estimated fetal weight is 8lb 5.   Fetal  presentation: vertex  Introitus: Normal vulva. Amniotic fluid character: clear.  Pelvis: adequate for delivery.   Cervix: Cervix evaluated by digital exam.     Fetal Exam Fetal Monitor Review: Mode: hand-held doppler probe.   Variability: moderate (6-25 bpm).    Fetal State Assessment: Category I - tracings are normal.     Physical Exam  Constitutional: She is oriented to person, place, and time. She appears well-developed and well-nourished.  HENT:  Head: Atraumatic.  Eyes: EOM are normal.  Neck: Neck supple.  Cardiovascular: Regular rhythm.   Respiratory: Breath sounds normal.  GI: Soft.  Musculoskeletal: She exhibits edema.  Neurological: She is alert and oriented to person, place, and time.  Skin: Skin is warm and dry.  Psychiatric: She has a normal mood and affect.    Prenatal labs: ABO, Rh: --/--/O POS, O POS (05/21 0315) Antibody: NEG (05/21 0315) Rubella: Immune (10/09 0000) RPR: Nonreactive (10/09 0000)  HBsAg: Negative (10/09 0000)  HIV: Non-reactive (10/09 0000)  GBS: Positive (10/14 0000)   Assessment/Plan: VBAC GBS cx (+) on PCN Term gestation P) admit routine labs PCN IV. Pitocin augmentation. Intracervical balloon  Melissa Joyce A 09/22/2012, 8:56 AM

## 2012-09-23 ENCOUNTER — Encounter (HOSPITAL_COMMUNITY): Payer: Self-pay | Admitting: Obstetrics and Gynecology

## 2012-09-23 DIAGNOSIS — Z98891 History of uterine scar from previous surgery: Secondary | ICD-10-CM

## 2012-09-23 DIAGNOSIS — O411290 Chorioamnionitis, unspecified trimester, not applicable or unspecified: Secondary | ICD-10-CM

## 2012-09-23 HISTORY — DX: History of uterine scar from previous surgery: Z98.891

## 2012-09-23 HISTORY — DX: Chorioamnionitis, unspecified trimester, not applicable or unspecified: O41.1290

## 2012-09-23 LAB — CCBB MATERNAL DONOR DRAW

## 2012-09-23 LAB — URINE CULTURE

## 2012-09-23 LAB — CBC
HCT: 27.1 % — ABNORMAL LOW (ref 36.0–46.0)
MCH: 25.5 pg — ABNORMAL LOW (ref 26.0–34.0)
MCHC: 34.7 g/dL (ref 30.0–36.0)
RDW: 16.5 % — ABNORMAL HIGH (ref 11.5–15.5)

## 2012-09-23 NOTE — Anesthesia Postprocedure Evaluation (Signed)
Anesthesia Post Note  Patient: Melissa Joyce  Procedure(s) Performed: Procedure(s) (LRB): CESAREAN SECTION (N/A)  Anesthesia type: Epidural  Patient location: Mother/Baby  Post pain: Pain level controlled  Post assessment: Post-op Vital signs reviewed  Last Vitals:  Filed Vitals:   09/23/12 0500  BP: 117/69  Pulse:   Temp:   Resp:     Post vital signs: Reviewed  Level of consciousness: awake  Complications: No apparent anesthesia complications

## 2012-09-23 NOTE — Op Note (Signed)
NAMEMYLYNN, DINH                 ACCOUNT NO.:  [id number]  MEDICAL RECORD NO.:  [id number]  LOCATION:  9146                          FACILITY:  [facility]  PHYSICIAN:  Maxie Better, M.D.DATE OF BIRTH:  1985-07-24  DATE OF PROCEDURE:  09/22/2012 DATE OF DISCHARGE:                              OPERATIVE REPORT   PREOPERATIVE DIAGNOSES:  Arrest of dilatation, previous cesarean section, presumed chorioamnionitis.  PROCEDURE:  Repeat cesarean section, Kerr hysterotomy.  POSTOPERATIVE DIAGNOSES:  Previous cesarean section, arrest of dilatation, right occiput posterior presentation, presumed chorioamnionitis.  ANESTHESIA:  Epidural.  SURGEON:  Maxie Better, MD  ASSISTANT:  Genia Del, MD.  PROCEDURE:  Under adequate epidural anesthesia, the patient was placed in a supine position with left lateral tilt.  She was sterilely prepped and draped in usual fashion.  Indwelling Foley catheter was already in place.  A 0.25% Marcaine was injected along the previous Pfannenstiel skin incision.  Pfannenstiel skin incision was then made, carried down to the rectus fascia.  Rectus fascia opened transversely.  Rectus fascia was then bluntly and sharply dissected off the rectus muscle in superior and inferior fashion.  Incidental entry into parietal peritoneum was accomplished.  The parietal peritoneum was opened superiorly and inferiorly.  The omental adhesions off the anterior wall were lysed. Vesicouterine peritoneum was opened transversely.  There was a well- developed lower uterine segment.  A curvilinear low transverse uterine incision was then made and extended with bandage scissors.  Moderate meconium fluid was noted.  The right hand of the baby was noted Subsequent delivery of a live female from the right occiput posterior presentation was accomplished.  The baby was bulb suctioned from the abdomen.  The cord was clamped and cut.  The baby was transferred to the awaiting  pediatrician, who assigned Apgars of 9 and 9 at 1 and 5 minutes.  The placenta was spontaneous, intact, sent to Pathology. Uterine cavity was cleaned of debris.  Uterine incision had no extension.  Uterine incision was closed in 2 layers, the first layer with 0 Monocryl running locked stitch, second layer with imbricating 0 Monocryl suture.  Normal tubes and ovaries were noted bilaterally.  The abdomen was copiously irrigated, suctioned of debris.  Interceed was placed overlying the lower portion of the uterine scar.  The parietal peritoneum was then closed with 2-0 Vicryl.  The rectus fascia was closed with 0 Vicryl x2.  The subcutaneous area was irrigated, small bleeders cauterized.  Interrupted 2-0 plain sutures placed and the skin approximated with Ethicon staples.  SPECIMENS:  Placenta sent to Pathology.  ESTIMATED BLOOD LOSS:  600 mL.  URINE OUTPUT:  200 mL.  INTRAOPERATIVE FLUID:  1500 mL.  COUNTS:  Sponge and instrument counts x2 was correct.  COMPLICATION:  None.  The patient tolerated the procedure well, was transferred to recovery in stable condition.     Maxie Better, M.D.     [place]/MEDQ  D:  09/22/2012  T:  09/23/2012  Job:  [id number]

## 2012-09-23 NOTE — Progress Notes (Signed)
Patient ID: Melissa Joyce, female   DOB: January 18, 1986, 27 y.o.   MRN: [id number] POD # 1  Subjective: Pt reports feeling "sleepy"; dozing/ Pain controlled with ibuprofen and intra op meds Tolerating po/ Foley patent/ No n/v/Flatus neg Activity: up with assistance Bleeding is light Newborn info:  Information for the patient's newborn:  Melissa, Joyce [[id number]]  female Feeding: breast   Objective: VS: Blood pressure 117/69, pulse 90, temperature 98.5 F (36.9 C), temperature source Oral, resp. rate 20.   Afebrile since 2300, Temp: 99.3   Intake/Output Summary (Last 24 hours) at 09/23/12 0716 Last data filed at 09/23/12 0443  Gross per 24 hour  Intake   1900 ml  Output   2250 ml  Net   -350 ml      Recent Labs  09/22/12 0315  WBC 11.5*  HGB 11.6*  HCT 33.3*  PLT 245    Blood type:  O POS (05/21 0315) Rubella: Immune (10/09 0000)    Physical Exam:  General: alert, cooperative and no distress CV: Regular rate and rhythm Resp: clear Abdomen: soft, nontender, hypoactive BS x 4 quads Incision: Covered with Tegaderm and honeycomb dressing; well approximated. Uterine Fundus: firm, below umbilicus, nontender Lochia: minimal Ext: edema +1 and Homans sign is negative, no sign of DVT    A/P: POD # 1/ G3 P 2 0 1 2 S/P Repeat C/Section d/t Arrest of descent and presumed chorioamnionitis Cont Ampicillin and Clindamycin IV a/o Continue routine post op orders   Signed: Demetrius Revel, MSN, [facility] 09/23/2012, 7:16 AM

## 2012-09-24 ENCOUNTER — Inpatient Hospital Stay (HOSPITAL_COMMUNITY): Admission: RE | Admit: 2012-09-24 | Source: Ambulatory Visit | Admitting: Obstetrics and Gynecology

## 2012-09-24 SURGERY — Surgical Case
Anesthesia: Spinal

## 2012-09-24 NOTE — Progress Notes (Signed)
POSTOPERATIVE DAY # 2 S/P CS   S:         Reports feeling well             Tolerating po intake / no nausea / no vomiting / + flatus / no BM             Bleeding is light             Pain controlled withmotrin and percocet             Up ad lib / ambulatory/ voiding QS  Newborn breast feeding  / female  O:  VS: BP 124/72  Pulse 71  Temp(Src) 98.1 F (36.7 C) (Oral)  Resp 18  Ht 5\' 2"  (1.575 m)  Wt 109.77 kg (242 lb)  BMI 44.25 kg/m2  SpO2 99%  LMP 12/16/2011   LABS:  Recent Labs  09/22/12 0315 09/23/12 0640  WBC 11.5* 20.7*  HGB 11.6* 9.4*  PLT 245 199                           I&O: Intake/Output     05/22 0701 - 05/23 0700 05/23 0701 - 05/24 0700   I.V. (mL/kg)     Total Intake(mL/kg)     Urine (mL/kg/hr) 1525 (0.6)    Blood     Total Output 1525     Net -1525                       Physical Exam:             Alert and Oriented X3  Lungs: Clear and unlabored  Heart: regular rate and rhythm / no mumurs  Abdomen: soft, non-tender, non-distended             Fundus: firm, non-tender             Dressing intact honeycomb          Lochia: light  Extremities: trace edema, no calf pain or tenderness  A:        POD # 2 S/P CS            stable status  P:        Routine postoperative care              DC in AM     Marlinda Mike CNM, MSN 09/24/2012, 11:14 AM

## 2012-09-25 MED ORDER — OXYCODONE-ACETAMINOPHEN 5-325 MG PO TABS: 1.0000 | ORAL_TABLET | ORAL | Status: DC | PRN

## 2012-09-25 MED ORDER — DOCUSATE SODIUM 100 MG PO CAPS: 100.0000 mg | ORAL_CAPSULE | Freq: Two times a day (BID) | ORAL | Status: DC

## 2012-09-25 MED ORDER — DSS 100 MG PO CAPS: 100.0000 mg | ORAL_CAPSULE | Freq: Two times a day (BID) | ORAL | Status: DC

## 2012-09-25 MED ORDER — IBUPROFEN 800 MG PO TABS: 800.0000 mg | ORAL_TABLET | Freq: Four times a day (QID) | ORAL | Status: DC | PRN

## 2012-09-25 MED ORDER — FERROUS SULFATE 325 (65 FE) MG PO TABS: 325.0000 mg | ORAL_TABLET | Freq: Every day | ORAL | Status: DC

## 2012-09-25 NOTE — Progress Notes (Signed)
POSTOPERATIVE DAY # 3 S/P CS   S:         Reports feeling sore and crampy today             Tolerating po intake / no nausea / no vomiting / +flatus / no BM             Bleeding is light             Pain controlled with motrin and percocet             Up ad lib / ambulatory/ voiding QS  Newborn breast feeding    O:  VS: BP 124/88  Pulse 77  Temp(Src) 99.1 F (37.3 C) (Oral)  Resp 18  Ht 5\' 2"  (1.575 m)  Wt 109.77 kg (242 lb)  BMI 44.25 kg/m2  SpO2 99%  LMP 12/16/2011             Physical Exam:             Alert and Oriented X3  Lungs: Clear and unlabored  Heart: regular rate and rhythm / no mumurs  Abdomen: soft, non-tender, non-distended active BS             Fundus: firm, non-tender, U-1             Dressing intact honeycomb              Incision:  approximated with staples / no erythema / no ecchymosis / no drainage  Perineum: mild  Lochia: light  Extremities: trace edema, no calf pain or tenderness, negative Homans  A:        POD # 3 S/P CS            ABL anemia  P:        Routine postoperative care              Dc home - [facility] booklet / instructions reviewed     Marlinda Mike CNM, MSN, [facility] 09/25/2012, 12:31 PM

## 2012-09-25 NOTE — Discharge Summary (Signed)
POSTOPERATIVE DISCHARGE SUMMARY:  Patient ID: Melissa Joyce MRN: [id number] DOB/AGE: 1985-12-11 26 y.o.  Admit date: 09/22/2012 Admission Diagnoses: term SROM / previous CS with desire for TOLAC / + GBS   Discharge date:  09/25/2012 Discharge Diagnoses: POD 3 s/p CS with arrest of labor and presumed chorioamnionitis / ABL anemia  Prenatal history: [id number]   EDC : 09/21/2012, Alternate EDD Entry  Prenatal care at [facility]  Primary provider : Ernestina Penna Prenatal course complicated by previous CS for arrest of labor / GBS + / Churchill trait  Prenatal Labs: ABO, Rh: O (10/09 0000) positive Antibody: NEG (05/21 0315) Rubella: Immune (10/09 0000)  RPR: NON REACTIVE (05/21 0315)  HBsAg: Negative (10/09 0000)  HIV: Non-reactive (10/09 0000)  GBS: Positive (10/14 0000)  1 hr Glucola : nl  Medical / Surgical History :  Past medical history:  Past Medical History  Diagnosis Date  . Sickle cell trait   . Anemia   . Postpartum care following cesarean delivery (09/22/12) 09/23/2012  . S/P repeat low transverse C-section 09/23/2012  . Chorioamnionitis 09/23/2012    Past surgical history:  Past Surgical History  Procedure Laterality Date  . Cesarean section    . Cesarean section N/A 09/22/2012    Procedure: CESAREAN SECTION;  Surgeon: Serita Kyle, MD;  Location: [facility]S;  Service: Obstetrics;  Laterality: N/A;    Family History:  Family History  Problem Relation Age of Onset  . Other Neg Hx   . Heart disease Father   . Anemia Mother     Social History:  reports that she has never smoked. She has never used smokeless tobacco. She reports that  drinks alcohol. She reports that she does not use illicit drugs.   Allergies: Shrimp    Current Medications at time of admission:  Prenatal daily   Intrapartum Course: admit with SROM - desire for TOLAC (previous CS for arrest of labor) + GBS - received IV antibiotics / arrest of labor at 5 cm with edematous cervix  and lack of fetal descent / maternal fever in labor with presumed chorioamnionitis   Procedures: Cesarean section delivery of  newborn by Dr Wonda Olds  See operative report for further details APGAR (1 MIN): 9   APGAR (5 MINS): 9    Postoperative / postpartum course: uneventful / afebrile postoperatively  Physical Exam:   VSS: Temp:  [97.6 F (36.4 C)-99.1 F (37.3 C)] 99.1 F (37.3 C) (05/24 0506) Pulse Rate:  [77-84] 77 (05/24 0506) Resp:  [18] 18 (05/24 0506) BP: (103-124)/(76-88) 124/88 mmHg (05/24 0506)  LABS:  Recent Labs  09/23/12 0640  WBC 20.7*  HGB 9.4*  PLT 199    General: pleasant / NAD or pain Heart:RRR Lungs:clear Abdomen: soft / active BS / uterus non-tender Extremities: 1+ pedal edema Dressing: intact Incision:  approximated with staples / no erythema / no ecchymosis / no drainage  Discharge Instructions:  Discharged Condition: stable Activity: pelvic rest and postoperative restrictions x 2 weeks Diet: routine Medications: see below   Medication List    TAKE these medications       acetaminophen 500 MG tablet  Commonly known as:  TYLENOL  Take 1,000 mg by mouth every 6 (six) hours as needed for pain.     ferrous sulfate 325 (65 FE) MG tablet  Take 1 tablet (325 mg total) by mouth daily.     ibuprofen 800 MG tablet  Commonly known as:  ADVIL,MOTRIN  Take 1 tablet (800 mg  total) by mouth every 6 (six) hours as needed for pain.     oxyCODONE-acetaminophen 5-325 MG per tablet  Commonly known as:  PERCOCET/ROXICET  Take 1-2 tablets by mouth every 4 (four) hours as needed.     prenatal multivitamin Tabs  Take 1 tablet by mouth daily.     zolpidem 5 MG tablet  Commonly known as:  AMBIEN  Take 5 mg by mouth at bedtime as needed for sleep.       Wound Care: dressing and staple removal at [facility] - call for apt Postpartum Instructions: [facility] discharge booklet - instructions reviewed Discharge to: Home  Follow up : [facility] in 6 weeks for routine postpartum visit                      Signed: Marlinda Mike CNM, MSN 09/25/2012, 12:36 PM

## 2014-01-17 ENCOUNTER — Encounter (HOSPITAL_COMMUNITY): Payer: Self-pay | Admitting: Family Medicine

## 2014-01-17 ENCOUNTER — Emergency Department (INDEPENDENT_AMBULATORY_CARE_PROVIDER_SITE_OTHER)
Admission: EM | Admit: 2014-01-17 | Discharge: 2014-01-17 | Disposition: A | Discharge status: Home / Self Care | Source: Home / Self Care | Attending: Family Medicine | Admitting: Family Medicine

## 2014-01-17 DIAGNOSIS — I10 Essential (primary) hypertension: Secondary | ICD-10-CM | POA: Diagnosis not present

## 2014-01-17 DIAGNOSIS — R509 Fever, unspecified: Secondary | ICD-10-CM

## 2014-01-17 DIAGNOSIS — J0140 Acute pansinusitis, unspecified: Secondary | ICD-10-CM

## 2014-01-17 DIAGNOSIS — H60399 Other infective otitis externa, unspecified ear: Secondary | ICD-10-CM

## 2014-01-17 DIAGNOSIS — H6093 Unspecified otitis externa, bilateral: Secondary | ICD-10-CM

## 2014-01-17 DIAGNOSIS — J018 Other acute sinusitis: Secondary | ICD-10-CM | POA: Diagnosis not present

## 2014-01-17 MED ORDER — CIPROFLOXACIN-DEXAMETHASONE 0.3-0.1 % OT SUSP: 4.0000 [drp] | Freq: Two times a day (BID) | OTIC | Status: DC

## 2014-01-17 MED ORDER — AMOXICILLIN-POT CLAVULANATE 875-125 MG PO TABS: 1.0000 | ORAL_TABLET | Freq: Two times a day (BID) | ORAL | Status: DC

## 2014-01-17 MED ORDER — FLUTICASONE PROPIONATE 50 MCG/ACT NA SUSP: 2.0000 | Freq: Every day | NASAL | Status: DC

## 2014-01-17 MED ORDER — IPRATROPIUM BROMIDE 0.06 % NA SOLN: 2.0000 | Freq: Four times a day (QID) | NASAL | Status: DC

## 2014-01-17 NOTE — ED Notes (Signed)
C/o runny nose, sore throat, earache, headache, chest pain, cough and vomiting onset Sunday with ST and other symptoms Mon.  V x 2 today.

## 2014-01-17 NOTE — ED Provider Notes (Signed)
CSN: [id number]     Arrival date & time 01/17/14  1744 History   First MD Initiated Contact with Patient 01/17/14 1822     No chief complaint on file.  (Consider location/radiation/quality/duration/timing/severity/associated sxs/prior Treatment) HPI  Sore throat started 3 days ago. Herbal tea w/o benefit. Associated w/ cough and post tussive emesis. Small amount of blood tinged emesis. Now w/ fevers to 101. Associated w/ chest congestion, sinus congestion and rinorrhea, HA. Denies SOB. Dayquil w/o benefit. 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   L ear paina nd drainage started several days ago. Getting worse.   Past Medical History  Diagnosis Date  . Sickle cell trait   . Anemia   . Postpartum care following cesarean delivery (09/22/12) 09/23/2012  . S/P repeat low transverse C-section 09/23/2012  . 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 Cesarean section    . Cesarean section N/A 09/22/2012    Procedure: CESAREAN SECTION;  Surgeon: Serita Kyle, MD;  Location: [facility];  Service: Obstetrics;  Laterality: N/A;   Family History  Problem Relation Age of Onset  . Other Neg Hx   . Heart disease Father   . Anemia Mother    History  Substance Use Topics  . Smoking status: Never Smoker   . Smokeless tobacco: Never Used  . Alcohol Use: Yes     Comment: wine on ocassion   OB History   Grav Para Term Preterm Abortions TAB SAB Ect Mult Living   0   2     Review of Systems Per HPI with all other pertinent systems negative.   Allergies  Shrimp  Home Medications   Prior to Admission medications   Medication Sig Start Date End Date Taking? Authorizing Provider  acetaminophen (TYLENOL) 500 MG tablet Take 1,000 mg by mouth every 6 (six) hours as needed for pain.    Historical Provider, MD  amoxicillin-clavulanate (AUGMENTIN) 875-125 MG per tablet Take 1 tablet by mouth 2 (two) times daily. 01/17/14   Ozella Rocks, MD   ciprofloxacin-dexamethasone [facility]) otic suspension Place 4 drops into both ears 2 (two) times daily. 01/17/14   Ozella Rocks, MD  docusate sodium 100 MG CAPS Take 100 mg by mouth 2 (two) times daily. 09/25/12   Marlinda Mike, CNM  ferrous sulfate 325 (65 FE) MG tablet Take 1 tablet (325 mg total) by mouth daily. 09/25/12   Marlinda Mike, CNM  fluticasone (FLONASE) 50 MCG/ACT nasal spray Place 2 sprays into both nostrils daily. 01/17/14   Ozella Rocks, MD  ibuprofen (ADVIL,MOTRIN) 800 MG tablet Take 1 tablet (800 mg total) by mouth every 6 (six) hours as needed for pain. 09/25/12   Marlinda Mike, CNM  ipratropium (ATROVENT) 0.06 % nasal spray Place 2 sprays into both nostrils 4 (four) times daily. 01/17/14   Ozella Rocks, MD  oxyCODONE-acetaminophen (PERCOCET/ROXICET) 5-325 MG per tablet Take 1-2 tablets by mouth every 4 (four) hours as needed. 09/25/12   Marlinda Mike, CNM  Prenatal Vit-Fe Fumarate-FA (PRENATAL MULTIVITAMIN) TABS Take 1 tablet by mouth daily.    Historical Provider, MD  zolpidem (AMBIEN) 5 MG tablet Take 5 mg by mouth at bedtime as needed for sleep.    Historical Provider, MD   BP 170/87  Pulse 75  Temp(Src) 98.7 F (37.1 C) (Oral)  Resp 18  SpO2 100% Physical Exam  Constitutional: She is oriented to person, place, and time. She  appears well-developed and well-nourished. No distress.  HENT:  Frontal and maxillary sinuses ttp External canals bilat w/ macerated skin. TM in tact bilat.  Nasal turbinates boggy   Eyes: EOM are normal. Pupils are equal, round, and reactive to light.  Neck: Normal range of motion.  Cardiovascular: Normal rate, normal heart sounds and intact distal pulses.   No murmur heard. Pulmonary/Chest: Effort normal and breath sounds normal. No respiratory distress.  Abdominal: Soft. Bowel sounds are normal.  Musculoskeletal: Normal range of motion. She exhibits no edema and no tenderness.  Neurological: She is alert and oriented to person, place, and  time.  Skin: Skin is warm. No rash noted. She is not diaphoretic.  Psychiatric: She has a normal mood and affect. Her behavior is normal. Judgment and thought content normal.    ED Course  Procedures (including critical care time) Labs Review Labs Reviewed - No data to display  Imaging Review No results found.   MDM   1. Acute pansinusitis, recurrence not specified   2. Fever, unspecified fever cause   3. Essential hypertension   4. Otitis externa, bilateral    NSAIDs, flonase, nasal atrovent, nasal saline,  Augmentin if not improving  Ciprodex for ears bilat.  Precautions given and all questions answered   Shelly Flatten, MD Family Medicine 01/17/2014, 7:31 PM     Ozella Rocks, MD 01/17/14 [phone]

## 2014-01-17 NOTE — Discharge Instructions (Signed)
You are suffering from sinusitis and and external ear infection Please start ibuprofen 600-800mg  every 6-8 hours Consider using flonase at night, atrovent to help with congestion, and nasal saline to flush out your nasal passages Please start the ciprodex for the ear pain and infection Please start the augmentin if you do not get better with the above therapies.

## 2014-03-06 ENCOUNTER — Encounter (HOSPITAL_COMMUNITY): Payer: Self-pay | Admitting: Family Medicine

## 2014-08-23 ENCOUNTER — Emergency Department (HOSPITAL_COMMUNITY)
Admission: EM | Admit: 2014-08-23 | Discharge: 2014-08-23 | Disposition: A | Discharge status: Home / Self Care | Payer: Medicaid Other | Attending: Emergency Medicine | Admitting: Emergency Medicine

## 2014-08-23 ENCOUNTER — Encounter (HOSPITAL_COMMUNITY): Payer: Self-pay

## 2014-08-23 ENCOUNTER — Emergency Department (HOSPITAL_COMMUNITY): Payer: Medicaid Other

## 2014-08-23 DIAGNOSIS — Z9889 Other specified postprocedural states: Secondary | ICD-10-CM | POA: Diagnosis not present

## 2014-08-23 DIAGNOSIS — Z792 Long term (current) use of antibiotics: Secondary | ICD-10-CM | POA: Diagnosis not present

## 2014-08-23 DIAGNOSIS — Z3A01 Less than 8 weeks gestation of pregnancy: Secondary | ICD-10-CM | POA: Insufficient documentation

## 2014-08-23 DIAGNOSIS — R109 Unspecified abdominal pain: Secondary | ICD-10-CM

## 2014-08-23 DIAGNOSIS — O26899 Other specified pregnancy related conditions, unspecified trimester: Secondary | ICD-10-CM

## 2014-08-23 DIAGNOSIS — O9989 Other specified diseases and conditions complicating pregnancy, childbirth and the puerperium: Secondary | ICD-10-CM | POA: Insufficient documentation

## 2014-08-23 DIAGNOSIS — D649 Anemia, unspecified: Secondary | ICD-10-CM | POA: Insufficient documentation

## 2014-08-23 DIAGNOSIS — R110 Nausea: Secondary | ICD-10-CM | POA: Insufficient documentation

## 2014-08-23 DIAGNOSIS — Z79899 Other long term (current) drug therapy: Secondary | ICD-10-CM | POA: Insufficient documentation

## 2014-08-23 DIAGNOSIS — Z7951 Long term (current) use of inhaled steroids: Secondary | ICD-10-CM | POA: Diagnosis not present

## 2014-08-23 DIAGNOSIS — R1011 Right upper quadrant pain: Secondary | ICD-10-CM | POA: Diagnosis not present

## 2014-08-23 DIAGNOSIS — R1031 Right lower quadrant pain: Secondary | ICD-10-CM | POA: Diagnosis not present

## 2014-08-23 DIAGNOSIS — R102 Pelvic and perineal pain: Secondary | ICD-10-CM

## 2014-08-23 DIAGNOSIS — Z3490 Encounter for supervision of normal pregnancy, unspecified, unspecified trimester: Secondary | ICD-10-CM

## 2014-08-23 LAB — ABO/RH: ABO/RH(D): O POS

## 2014-08-23 LAB — URINALYSIS, ROUTINE W REFLEX MICROSCOPIC
BILIRUBIN URINE: NEGATIVE
Glucose, UA: NEGATIVE mg/dL
HGB URINE DIPSTICK: NEGATIVE
KETONES UR: NEGATIVE mg/dL
Leukocytes, UA: NEGATIVE
NITRITE: NEGATIVE
PROTEIN: NEGATIVE mg/dL
SPECIFIC GRAVITY, URINE: 1.024 (ref 1.005–1.030)
UROBILINOGEN UA: 0.2 mg/dL (ref 0.0–1.0)
pH: 6 (ref 5.0–8.0)

## 2014-08-23 LAB — HEPATIC FUNCTION PANEL
ALT: 17 U/L (ref 0–35)
AST: 22 U/L (ref 0–37)
Albumin: 3.3 g/dL — ABNORMAL LOW (ref 3.5–5.2)
Alkaline Phosphatase: 41 U/L (ref 39–117)
Total Bilirubin: 0.6 mg/dL (ref 0.3–1.2)
Total Protein: 7.2 g/dL (ref 6.0–8.3)

## 2014-08-23 LAB — BASIC METABOLIC PANEL
Anion gap: 9 (ref 5–15)
BUN: 7 mg/dL (ref 6–23)
CALCIUM: 8.9 mg/dL (ref 8.4–10.5)
CO2: 21 mmol/L (ref 19–32)
Chloride: 105 mmol/L (ref 96–112)
Creatinine, Ser: 0.81 mg/dL (ref 0.50–1.10)
GFR calc Af Amer: >90 mL/min (ref >90)
GFR calc non Af Amer: >90 mL/min (ref >90)
GLUCOSE: 92 mg/dL (ref 70–99)
Potassium: 4.4 mmol/L (ref 3.5–5.1)
SODIUM: 135 mmol/L (ref 135–145)

## 2014-08-23 LAB — HCG, QUANTITATIVE, PREGNANCY: HCG, BETA CHAIN, QUANT, S: 107621 m[IU]/mL — AB (ref <5)

## 2014-08-23 LAB — CBC WITH DIFFERENTIAL/PLATELET
BASOS ABS: 0 10*3/uL (ref 0.0–0.1)
Basophils Relative: 0 % (ref 0–1)
EOS ABS: 0.3 10*3/uL (ref 0.0–0.7)
EOS PCT: 2 % (ref 0–5)
HCT: 33.1 % — ABNORMAL LOW (ref 36.0–46.0)
Hemoglobin: 11.4 g/dL — ABNORMAL LOW (ref 12.0–15.0)
LYMPHS ABS: 4.4 10*3/uL — AB (ref 0.7–4.0)
Lymphocytes Relative: 28 % (ref 12–46)
MCH: 23.9 pg — ABNORMAL LOW (ref 26.0–34.0)
MCHC: 34.4 g/dL (ref 30.0–36.0)
MCV: 69.5 fL — ABNORMAL LOW (ref 78.0–100.0)
MONO ABS: 0.8 10*3/uL (ref 0.1–1.0)
Monocytes Relative: 5 % (ref 3–12)
NEUTROS PCT: 65 % (ref 43–77)
Neutro Abs: 10.1 10*3/uL — ABNORMAL HIGH (ref 1.7–7.7)
PLATELETS: 319 10*3/uL (ref 150–400)
RBC: 4.76 MIL/uL (ref 3.87–5.11)
RDW: 17.1 % — AB (ref 11.5–15.5)
WBC: 15.6 10*3/uL — AB (ref 4.0–10.5)

## 2014-08-23 LAB — GC/CHLAMYDIA PROBE AMP (~~LOC~~) NOT AT ARMC
Chlamydia: NEGATIVE
NEISSERIA GONORRHEA: NEGATIVE

## 2014-08-23 LAB — WET PREP, GENITAL: Trich, Wet Prep: NONE SEEN

## 2014-08-23 LAB — HIV ANTIBODY (ROUTINE TESTING W REFLEX): HIV SCREEN 4TH GENERATION: NONREACTIVE

## 2014-08-23 MED ORDER — ONDANSETRON 4 MG PO TBDP
8.0000 mg | ORAL_TABLET | Freq: Once | ORAL | Status: AC
  Administered 2014-08-23: 8 mg via ORAL
  Filled 2014-08-23: qty 2

## 2014-08-23 NOTE — ED Provider Notes (Signed)
CSN: [id number]     Arrival date & time 08/23/14  0308 History   First MD Initiated Contact with Patient 08/23/14 [phone]     Chief Complaint  Patient presents with  . Abdominal Pain     (Consider location/radiation/quality/duration/timing/severity/associated sxs/prior Treatment) Patient is a 29 y.o. female presenting with abdominal pain. The history is provided by the patient. No language interpreter was used.  Abdominal Pain Pain location:  RUQ and RLQ Pain quality: aching   Associated symptoms: nausea   Associated symptoms: no chills, no dysuria, no fever, no vaginal bleeding, no vaginal discharge and no vomiting   Associated symptoms comment:  She was seen at Urgent Care yesterday and found to be pregnant. She was having mild right lower quadrant discomfort and was instructed to come to the emergency department for evaluation of ectopic pregnancy. She reports nausea without vomiting. No fever. She denies vaginal discharge, dysuria, vaginal bleeding. She is a G4P2A1. No complications associated with pregnancies and were carried to term.    Past Medical History  Diagnosis Date  . Sickle cell trait   . Anemia   . Postpartum care following cesarean delivery (09/22/12) 09/23/2012  . S/P repeat low transverse C-section 09/23/2012  . 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 Cesarean section  2005  . Cesarean section N/A 09/22/2012    Procedure: CESAREAN SECTION;  Surgeon: Melissa Kyle, MD;  Location: [facility];  Service: Obstetrics;  Laterality: N/A;   Family History  Problem Relation Age of Onset  . Other Neg Hx   . Heart disease Father   . Anemia Mother    History  Substance Use Topics  . Smoking status: Never Smoker   . Smokeless tobacco: Never Used  . Alcohol Use: Yes     Comment: wine on ocassion   OB History    Gravida Para Term Preterm AB TAB SAB Ectopic Multiple Living   3 2 1  1 1  0   2     Review of Systems   Constitutional: Negative for fever and chills.  HENT: Negative.   Respiratory: Negative.   Cardiovascular: Negative.   Gastrointestinal: Positive for nausea and abdominal pain. Negative for vomiting.  Genitourinary: Negative for dysuria, vaginal bleeding and vaginal discharge.  Musculoskeletal: Negative.   Skin: Negative.   Neurological: Negative.       Allergies  Shrimp  Home Medications   Prior to Admission medications   Medication Sig Start Date End Date Taking? Authorizing Provider  acetaminophen (TYLENOL) 500 MG tablet Take 1,000 mg by mouth every 6 (six) hours as needed for pain.    Historical Provider, MD  amoxicillin-clavulanate (AUGMENTIN) 875-125 MG per tablet Take 1 tablet by mouth 2 (two) times daily. 01/17/14   Ozella Rocks, MD  ciprofloxacin-dexamethasone [facility]) otic suspension Place 4 drops into both ears 2 (two) times daily. 01/17/14   Ozella Rocks, MD  docusate sodium 100 MG CAPS Take 100 mg by mouth 2 (two) times daily. 09/25/12   Marlinda Mike, CNM  ferrous sulfate 325 (65 FE) MG tablet Take 1 tablet (325 mg total) by mouth daily. 09/25/12   Marlinda Mike, CNM  fluticasone (FLONASE) 50 MCG/ACT nasal spray Place 2 sprays into both nostrils daily. 01/17/14   Ozella Rocks, MD  ibuprofen (ADVIL,MOTRIN) 800 MG tablet Take 1 tablet (800 mg total) by mouth every 6 (six) hours as needed for pain. 09/25/12   Marlinda Mike, CNM  ipratropium (ATROVENT) 0.06 %  nasal spray Place 2 sprays into both nostrils 4 (four) times daily. 01/17/14   Ozella Rocks, MD  oxyCODONE-acetaminophen (PERCOCET/ROXICET) 5-325 MG per tablet Take 1-2 tablets by mouth every 4 (four) hours as needed. 09/25/12   Marlinda Mike, CNM  Prenatal Vit-Fe Fumarate-FA (PRENATAL MULTIVITAMIN) TABS Take 1 tablet by mouth daily.    Historical Provider, MD  zolpidem (AMBIEN) 5 MG tablet Take 5 mg by mouth at bedtime as needed for sleep.    Historical Provider, MD   BP 118/68 mmHg  Pulse 84  Temp(Src) 98.5 F  (36.9 C) (Oral)  Resp 16  Ht  (1.549 m)  Wt 234 lb (106.142 kg)  BMI 44.24 kg/m2  SpO2 99%  LMP 07/07/2014 (Exact Date)  Breastfeeding? No Physical Exam  Constitutional: She is oriented to person, place, and time. She appears well-developed and well-nourished.  HENT:  Head: Normocephalic.  Neck: Normal range of motion. Neck supple.  Cardiovascular: Normal rate and regular rhythm.   Pulmonary/Chest: Effort normal and breath sounds normal.  Abdominal: Soft. Bowel sounds are normal. She exhibits no mass. There is tenderness. There is no rebound and no guarding.  Tender in RLQ and RUQ abdomen. No guarding.   Genitourinary:  White, milky vaginal discharge present. CMT and right adnexal tenderness without mass. No left adnexal tenderness. No cervical bleeding.   Musculoskeletal: Normal range of motion.  Neurological: She is alert and oriented to person, place, and time.  Skin: Skin is warm and dry. No rash noted.  Psychiatric: She has a normal mood and affect.    ED Course  Procedures (including critical care time) Labs Review Labs Reviewed  HCG, QUANTITATIVE, PREGNANCY - Abnormal; Notable for the following:    hCG, Beta Melissa Joyce [id number] (*)    All other components within normal limits  CBC WITH DIFFERENTIAL/PLATELET - Abnormal; Notable for the following:    WBC 15.6 (*)    Hemoglobin 11.4 (*)    HCT 33.1 (*)    MCV 69.5 (*)    MCH 23.9 (*)    RDW 17.1 (*)    Neutro Abs 10.1 (*)    Lymphs Abs 4.4 (*)    All other components within normal limits  URINALYSIS, ROUTINE W REFLEX MICROSCOPIC - Abnormal; Notable for the following:    APPearance CLOUDY (*)    All other components within normal limits  HEPATIC FUNCTION PANEL - Abnormal; Notable for the following:    Albumin 3.3 (*)    All other components within normal limits  WET PREP, GENITAL  BASIC METABOLIC PANEL  HIV ANTIBODY (ROUTINE TESTING)  ABO/RH  GC/CHLAMYDIA PROBE AMP ([place])   Results for orders  placed or performed during the hospital encounter of 08/23/14  Wet prep, genital  Result Value Ref Range   Yeast Wet Prep HPF POC FEW (A) NONE SEEN   Trich, Wet Prep NONE SEEN NONE SEEN   Clue Cells Wet Prep HPF POC FEW (A) NONE SEEN   WBC, Wet Prep HPF POC FEW (A) NONE SEEN  hCG, quantitative, pregnancy  Result Value Ref Range   hCG, Beta Chain, Quant, S [id number] (H) <5 mIU/mL  CBC with Differential  Result Value Ref Range   WBC 15.6 (H) 4.0 - 10.5 K/uL   RBC 4.76 3.87 - 5.11 MIL/uL   Hemoglobin 11.4 (L) 12.0 - 15.0 g/dL   HCT 81.1 (L) 91.4 - 78.2 %   MCV 69.5 (L) 78.0 - 100.0 fL   MCH 23.9 (L) 26.0 -  34.0 pg   MCHC 34.4 30.0 - 36.0 g/dL   RDW 09.817.1 (H) 11.911.5 - 14.715.5 %   Platelets 319 150 - 400 K/uL   Neutrophils Relative % 65 43 - 77 %   Lymphocytes Relative 28 12 - 46 %   Monocytes Relative 5 3 - 12 %   Eosinophils Relative 2 0 - 5 %   Basophils Relative 0 0 - 1 %   Neutro Abs 10.1 (H) 1.7 - 7.7 K/uL   Lymphs Abs 4.4 (H) 0.7 - 4.0 K/uL   Monocytes Absolute 0.8 0.1 - 1.0 K/uL   Eosinophils Absolute 0.3 0.0 - 0.7 K/uL   Basophils Absolute 0.0 0.0 - 0.1 K/uL   RBC Morphology TARGET CELLS   Basic metabolic panel  Result Value Ref Range   Sodium 135 135 - 145 mmol/L   Potassium 4.4 3.5 - 5.1 mmol/L   Chloride 105 96 - 112 mmol/L   CO2 21 19 - 32 mmol/L   Glucose, Bld 92 70 - 99 mg/dL   BUN 7 6 - 23 mg/dL   Creatinine, Ser 8.290.81 0.50 - 1.10 mg/dL   Calcium 8.9 8.4 - 56.210.5 mg/dL   GFR calc non Af Amer >90 >90 mL/min   GFR calc Af Amer >90 >90 mL/min   Anion gap 9 5 - 15  Urinalysis, Routine w reflex microscopic  Result Value Ref Range   Color, Urine YELLOW YELLOW   APPearance CLOUDY (A) CLEAR   Specific Gravity, Urine 1.024 1.005 - 1.030   pH 6.0 5.0 - 8.0   Glucose, UA NEGATIVE NEGATIVE mg/dL   Hgb urine dipstick NEGATIVE NEGATIVE   Bilirubin Urine NEGATIVE NEGATIVE   Ketones, ur NEGATIVE NEGATIVE mg/dL   Protein, ur NEGATIVE NEGATIVE mg/dL   Urobilinogen, UA 0.2 0.0 -  1.0 mg/dL   Nitrite NEGATIVE NEGATIVE   Leukocytes, UA NEGATIVE NEGATIVE  Hepatic function panel  Result Value Ref Range   Total Protein 7.2 6.0 - 8.3 g/dL   Albumin 3.3 (L) 3.5 - 5.2 g/dL   AST 22 0 - 37 U/L   ALT 17 0 - 35 U/L   Alkaline Phosphatase 41 39 - 117 U/L   Total Bilirubin 0.6 0.3 - 1.2 mg/dL   Bilirubin, Direct <1.3<0.1 0.0 - 0.5 mg/dL   Indirect Bilirubin NOT CALCULATED 0.3 - 0.9 mg/dL  ABO/Rh  Result Value Ref Range   ABO/RH(D) O POS    No rh immune globuloin NOT A RH IMMUNE GLOBULIN CANDIDATE, PT RH POSITIVE    [place] Ob Comp Less 14 Wks  08/23/2014   CLINICAL DATA:  Pelvic pain, positive urine pregnancy test.  EXAM: OBSTETRIC <14 WK US AND TRANSVAGINAL OB US  TECHNIQUE: Both transabdominal and transvaginal ultrasound examinations were performed for complete evaluation of the gestation as well as the maternal uterus, adnexal regions, and pelvic cul-de-sac. Transvaginal technique was performed to assess early pregnancy.  COMPARISON:  None.  FINDINGS: Intrauterine gestational sac: Visualized/normal in shape.  Yolk sac:  Identified  Embryo:  Identified  Cardiac Activity: Identified  Heart Rate: 135  bpm  CRL:  11  mm   7 w   1 d                  US EDC: 04/10/2015  Maternal uterus/adnexae: Uterus measures 12.2 x 6.8 x 7.0 cm. Right ovary not visualized. Normal sonographic appearance to the left ovary, with corpus luteal cyst measuring 3.0 x 2.1 x 2.5 cm. No free fluid.  IMPRESSION: Single intrauterine gestation with cardiac activity documented. Estimated age of 7 weeks 1 day by crown-rump length.   Electronically Signed   By: Jearld Lesch M.D.   On: 08/23/2014 04:37   US Ob Transvaginal  08/23/2014   CLINICAL DATA:  Pelvic pain, positive urine pregnancy test.  EXAM: OBSTETRIC <14 WK [place] AND TRANSVAGINAL OB US  TECHNIQUE: Both transabdominal and transvaginal ultrasound examinations were performed for complete evaluation of the gestation as well as the maternal uterus, adnexal regions,  and pelvic cul-de-sac. Transvaginal technique was performed to assess early pregnancy.  COMPARISON:  None.  FINDINGS: Intrauterine gestational sac: Visualized/normal in shape.  Yolk sac:  Identified  Embryo:  Identified  Cardiac Activity: Identified  Heart Rate: 135  bpm  CRL:  11  mm   7 w   1 d                  [place] EDC: 04/10/2015  Maternal uterus/adnexae: Uterus measures 12.2 x 6.8 x 7.0 cm. Right ovary not visualized. Normal sonographic appearance to the left ovary, with corpus luteal cyst measuring 3.0 x 2.1 x 2.5 cm. No free fluid.  IMPRESSION: Single intrauterine gestation with cardiac activity documented. Estimated age of 7 weeks 1 day by crown-rump length.   Electronically Signed   By: Jearld Lesch M.D.   On: 08/23/2014 04:37    Imaging Review US Ob Comp Less 14 Wks  08/23/2014   CLINICAL DATA:  Pelvic pain, positive urine pregnancy test.  EXAM: OBSTETRIC <14 WK [place] AND TRANSVAGINAL OB US  TECHNIQUE: Both transabdominal and transvaginal ultrasound examinations were performed for complete evaluation of the gestation as well as the maternal uterus, adnexal regions, and pelvic cul-de-sac. Transvaginal technique was performed to assess early pregnancy.  COMPARISON:  None.  FINDINGS: Intrauterine gestational sac: Visualized/normal in shape.  Yolk sac:  Identified  Embryo:  Identified  Cardiac Activity: Identified  Heart Rate: 135  bpm  CRL:  11  mm   7 w   1 d                  [place] EDC: 04/10/2015  Maternal uterus/adnexae: Uterus measures 12.2 x 6.8 x 7.0 cm. Right ovary not visualized. Normal sonographic appearance to the left ovary, with corpus luteal cyst measuring 3.0 x 2.1 x 2.5 cm. No free fluid.  IMPRESSION: Single intrauterine gestation with cardiac activity documented. Estimated age of 7 weeks 1 day by crown-rump length.   Electronically Signed   By: Jearld Lesch M.D.   On: 08/23/2014 04:37   US Ob Transvaginal  08/23/2014   CLINICAL DATA:  Pelvic pain, positive urine pregnancy test.  EXAM:  OBSTETRIC <14 WK [place] AND TRANSVAGINAL OB US  TECHNIQUE: Both transabdominal and transvaginal ultrasound examinations were performed for complete evaluation of the gestation as well as the maternal uterus, adnexal regions, and pelvic cul-de-sac. Transvaginal technique was performed to assess early pregnancy.  COMPARISON:  None.  FINDINGS: Intrauterine gestational sac: Visualized/normal in shape.  Yolk sac:  Identified  Embryo:  Identified  Cardiac Activity: Identified  Heart Rate: 135  bpm  CRL:  11  mm   7 w   1 d                  [place] EDC: 04/10/2015  Maternal uterus/adnexae: Uterus measures 12.2 x 6.8 x 7.0 cm. Right ovary not visualized. Normal sonographic appearance to the left ovary, with corpus luteal cyst measuring 3.0 x 2.1 x 2.5  cm. No free fluid.  IMPRESSION: Single intrauterine gestation with cardiac activity documented. Estimated age of 7 weeks 1 day by crown-rump length.   Electronically Signed   By: Jearld Lesch M.D.   On: 08/23/2014 04:37     EKG Interpretation None      MDM   Final diagnoses:  Pelvic pain affecting pregnancy    1. Abdominal pain 2. IUP, [redacted]w[redacted]d  IUP confirmed on ultrasound. She continues to have pain in the right abdomen, has nausea (not present prior to yesterday with onset of discomfort) and leukocytosis to 15.6 - concerning for appendicitis. MRI ordered to evaluate.  Patient care transferred to [facility], PA-C, pending MRI.      Elpidio Anis, PA-C 08/23/14 [id number]  Geoffery Lyons, MD 08/23/14 [phone]

## 2014-08-23 NOTE — ED Notes (Signed)
Pelvic cart to the bedside

## 2014-08-23 NOTE — Discharge Instructions (Signed)
Abdominal Pain During Pregnancy Abdominal pain is common in pregnancy. Most of the time, it does not cause harm. There are many causes of abdominal pain. Some causes are more serious than others. Some of the causes of abdominal pain in pregnancy are easily diagnosed. Occasionally, the diagnosis takes time to understand. Other times, the cause is not determined. Abdominal pain can be a sign that something is very wrong with the pregnancy, or the pain may have nothing to do with the pregnancy at all. For this reason, always tell your health care provider if you have any abdominal discomfort. HOME CARE INSTRUCTIONS  Monitor your abdominal pain for any changes. The following actions may help to alleviate any discomfort you are experiencing:  Do not have sexual intercourse or put anything in your vagina until your symptoms go away completely.  Get plenty of rest until your pain improves.  Drink clear fluids if you feel nauseous. Avoid solid food as long as you are uncomfortable or nauseous.  Only take over-the-counter or prescription medicine as directed by your health care provider.  Keep all follow-up appointments with your health care provider. SEEK IMMEDIATE MEDICAL CARE IF:  You are bleeding, leaking fluid, or passing tissue from the vagina.  You have increasing pain or cramping.  You have persistent vomiting.  You have painful or bloody urination.  You have a fever.  You notice a decrease in your baby's movements.  You have extreme weakness or feel faint.  You have shortness of breath, with or without abdominal pain.  You develop a severe headache with abdominal pain.  You have abnormal vaginal discharge with abdominal pain.  You have persistent diarrhea.  You have abdominal pain that continues even after rest, or gets worse. MAKE SURE YOU:   Understand these instructions.  Will watch your condition.  Will get help right away if you are not doing well or get  worse. Document Released: 04/21/2005 Document Revised: 02/09/2013 Document Reviewed: 11/18/2012 [facility] Patient Information 2015 [place] [place]. This information is not intended to replace advice given to you by your health care provider. Make sure you discuss any questions you have with your health care provider.  You were evaluated in the ED today for your abdominal discomfort. There does not appear to be an emergent cause for your symptoms at this time. It is important he follow-up with primary care for further evaluation and management of your symptoms. Return to ED for new or worsening symptoms.

## 2014-08-23 NOTE — ED Notes (Signed)
[facility] a [facility], pt is having RLQ pain, [facility] to [facility] to make sure she "wasn't pregnant in her tubes"

## 2014-08-23 NOTE — ED Notes (Signed)
Pelvic performed by Melvenia BeamShari - PA and Smith Robertanya T. - EMT assisted

## 2014-08-23 NOTE — ED Provider Notes (Signed)
Patient care signed out to me by Allean FoundSherri Upstill, PA-C. Patient is [redacted] weeks pregnant with confirmed IUP on ultrasound. Presents with abdominal discomfort. Plan is for MR abdomen to rule out appendicitis. If negative the patient may be discharged home to follow up with primary care.  MR abdomen is negative for appendicitis or any other acute or emergent pathology. Will discharge patient home to follow-up with primary care for further evaluation and management of symptoms.  Filed Vitals:   08/23/14 0449 08/23/14 0500 08/23/14 0555 08/23/14 0600  BP: 108/61 118/68 103/70 108/68  Pulse: 68 84 85 70  Temp:      TempSrc:      Resp: 17 16    Height:      Weight:      SpO2: 98% 99% 96% 95%     Joycie PeekBenjamin Lavonna Lampron, PA-C 08/23/14 [id number]  Geoffery Lyonsouglas Delo, MD 08/23/14 1318

## 2014-09-04 ENCOUNTER — Encounter (HOSPITAL_COMMUNITY): Payer: Self-pay | Admitting: Emergency Medicine

## 2014-09-04 ENCOUNTER — Emergency Department (HOSPITAL_COMMUNITY)
Admission: EM | Admit: 2014-09-04 | Discharge: 2014-09-04 | Disposition: A | Discharge status: Home / Self Care | Payer: Medicaid Other | Attending: Emergency Medicine | Admitting: Emergency Medicine

## 2014-09-04 DIAGNOSIS — Z3A09 9 weeks gestation of pregnancy: Secondary | ICD-10-CM | POA: Diagnosis not present

## 2014-09-04 DIAGNOSIS — R197 Diarrhea, unspecified: Secondary | ICD-10-CM | POA: Diagnosis not present

## 2014-09-04 DIAGNOSIS — J029 Acute pharyngitis, unspecified: Secondary | ICD-10-CM | POA: Diagnosis not present

## 2014-09-04 DIAGNOSIS — O9989 Other specified diseases and conditions complicating pregnancy, childbirth and the puerperium: Secondary | ICD-10-CM | POA: Insufficient documentation

## 2014-09-04 DIAGNOSIS — O99511 Diseases of the respiratory system complicating pregnancy, first trimester: Secondary | ICD-10-CM | POA: Diagnosis not present

## 2014-09-04 DIAGNOSIS — O99011 Anemia complicating pregnancy, first trimester: Secondary | ICD-10-CM | POA: Insufficient documentation

## 2014-09-04 DIAGNOSIS — O210 Mild hyperemesis gravidarum: Secondary | ICD-10-CM | POA: Insufficient documentation

## 2014-09-04 DIAGNOSIS — R05 Cough: Secondary | ICD-10-CM | POA: Insufficient documentation

## 2014-09-04 DIAGNOSIS — R059 Cough, unspecified: Secondary | ICD-10-CM

## 2014-09-04 HISTORY — DX: Encounter for supervision of normal pregnancy, unspecified, unspecified trimester: Z34.90

## 2014-09-04 MED ORDER — ALBUTEROL SULFATE HFA 108 (90 BASE) MCG/ACT IN AERS
2.0000 | INHALATION_SPRAY | Freq: Once | RESPIRATORY_TRACT | Status: AC
  Administered 2014-09-04: 2 via RESPIRATORY_TRACT
  Filled 2014-09-04: qty 6.7

## 2014-09-04 NOTE — ED Notes (Signed)
Pt states her throat also feels swollen.

## 2014-09-04 NOTE — ED Provider Notes (Signed)
CSN: [id number]     Arrival date & time 09/04/14  0127 History  This chart was scribed for Melissa Rhine, MD by Annye Asa, ED Scribe. This patient was seen in room D35C/D35C and the patient's care was started at 3:11 AM.    Chief Complaint  Patient presents with  . Cough   Patient is a 29 y.o. female presenting with cough. The history is provided by the patient. No language interpreter was used.  Cough Cough characteristics:  Vomit-inducing Severity:  Moderate Onset quality:  Gradual Duration:  2 days Timing:  Intermittent Progression:  Unchanged Chronicity:  New Smoker: no   Relieved by:  Nothing Worsened by:  Nothing tried Ineffective treatments:  Cough suppressants and decongestant (Robitussin) Associated symptoms: fever and sore throat      HPI Comments: Melissa Joyce is a [redacted] weeks pregnant, otherwise healthy 29 y.o. female who presents to the Emergency Department complaining of 2 days of cough, exacerbated when lying supine. She also reports sore throat, diarrhea, fever. Patient notes several episodes of post-tussive emesis today, prompting her to come to the ED tonight. She took Robitussin without relief. She denies vaginal bleeding, recent antibiotic use.   OB care under Dr. Cherly Hensen  She has had ultrasound that confirmed IUP  Past Medical History  Diagnosis Date  . Sickle cell trait   . Anemia   . Postpartum care following cesarean delivery (09/22/12) 09/23/2012  . S/P repeat low transverse C-section 09/23/2012  . 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 Pregnant    Past Surgical History  Procedure Laterality Date  . Cesarean section  2005  . Cesarean section N/A 09/22/2012    Procedure: CESAREAN SECTION;  Surgeon: Serita Kyle, MD;  Location: [facility];  Service: Obstetrics;  Laterality: N/A;   Family History  Problem Relation Age of Onset  . Other Neg Hx   . Heart disease Father   . Anemia Mother    History  Substance Use Topics  . Smoking status: Never Smoker   .  Smokeless tobacco: Never Used  . Alcohol Use: Yes     Comment: wine on ocassion   OB History    Gravida Para Term Preterm AB TAB SAB Ectopic Multiple Living   0   2     Review of Systems  Constitutional: Positive for fever.  HENT: Positive for sore throat.   Respiratory: Positive for cough.   Gastrointestinal: Positive for vomiting and diarrhea.  Genitourinary: Negative for vaginal bleeding.  All other systems reviewed and are negative.  Allergies  Shrimp  Home Medications   Prior to Admission medications   Medication Sig Start Date End Date Taking? Authorizing Provider  acetaminophen (TYLENOL) 500 MG tablet Take 1,000 mg by mouth every 6 (six) hours as needed for pain.    Historical Provider, MD  amoxicillin-clavulanate (AUGMENTIN) 875-125 MG per tablet Take 1 tablet by mouth 2 (two) times daily. Patient not taking: Reported on 08/23/2014 01/17/14   Ozella Rocks, MD  ciprofloxacin-dexamethasone [facility]) otic suspension Place 4 drops into both ears 2 (two) times daily. Patient not taking: Reported on 08/23/2014 01/17/14   Ozella Rocks, MD  docusate sodium 100 MG CAPS Take 100 mg by mouth 2 (two) times daily. Patient not taking: Reported on 08/23/2014 09/25/12   Marlinda Mike, CNM  ferrous sulfate 325 (65 FE) MG tablet Take 1 tablet (325 mg total) by mouth daily. Patient not taking: Reported on 08/23/2014 09/25/12   Marlinda Mike, CNM  fluticasone (FLONASE) 50 MCG/ACT nasal spray Place 2 sprays into both nostrils daily. Patient not taking: Reported on 08/23/2014 01/17/14   Ozella Rocksavid J Merrell, MD  ibuprofen (ADVIL,MOTRIN) 800 MG tablet Take 1 tablet (800 mg total) by mouth every 6 (six) hours as needed for pain. Patient not taking: Reported on 08/23/2014 09/25/12   Marlinda Mikeanya Bailey, CNM  ipratropium (ATROVENT) 0.06 % nasal spray Place 2 sprays into both nostrils 4 (four) times daily. Patient not taking: Reported on 08/23/2014 01/17/14   Ozella Rocksavid J Merrell, MD  oxyCODONE-acetaminophen  (PERCOCET/ROXICET) 5-325 MG per tablet Take 1-2 tablets by mouth every 4 (four) hours as needed. Patient not taking: Reported on 08/23/2014 09/25/12   Marlinda Mikeanya Bailey, CNM  Prenatal Vit-Fe Fumarate-FA (PRENATAL MULTIVITAMIN) TABS Take 1 tablet by mouth daily.    Historical Provider, MD  zolpidem (AMBIEN) 5 MG tablet Take 5 mg by mouth at bedtime as needed for sleep.    Historical Provider, MD   BP 102/43 mmHg  Pulse 77  Temp(Src) 98.5 F (36.9 C) (Oral)  Resp 16  Ht 5\' 1"  (1.549 m)  Wt 238 lb 14.4 oz (108.364 kg)  BMI 45.16 kg/m2  SpO2 99%  LMP 07/07/2014 (Exact Date) Physical Exam  Nursing note and vitals reviewed.  CONSTITUTIONAL: Well developed/well nourished HEAD: Normocephalic/atraumatic EYES: EOMI/PERRL ENMT: Mucous membranes moist, uvula midline, no erythema or exudates or edema, bilateral TMs clear and intact NECK: supple no meningeal signs SPINE/BACK:entire spine nontender CV: S1/S2 noted, no murmurs/rubs/gallops noted LUNGS: Lungs are clear to auscultation bilaterally, no apparent distress ABDOMEN: soft, nontender, no rebound or guarding, bowel sounds noted throughout abdomen GU:no cva tenderness NEURO: Pt is awake/alert/appropriate, moves all extremitiesx4.  No facial droop.   EXTREMITIES: pulses normal/equal, full ROM SKIN: warm, color normal PSYCH: no abnormalities of mood noted, alert and oriented to situation  ED Course  Procedures   DIAGNOSTIC STUDIES: Oxygen Saturation is 97% on RA, adequate by my interpretation.    COORDINATION OF CARE: 3:17 AM Discussed treatment plan with pt at bedside and pt agreed to plan.  I offered albuterol to patient for her cough -she accepted No evidence of pneumonia by exam She is well appearing Suspect viral illness Advised she can use APAP but she is limited in other meds due to pregnancy status BP 106/66 mmHg  Pulse 76  Temp(Src) 98.5 F (36.9 C) (Oral)  Resp 16  Ht 5\' 1"  (1.549 m)  Wt 238 lb 14.4 oz (108.364 kg)  BMI  45.16 kg/m2  SpO2 99%  LMP 07/07/2014 (Exact Date)   MDM   Final diagnoses:  Cough    Nursing notes including past medical history and social history reviewed and considered in documentation  I personally performed the services described in this documentation, which was scribed in my presence. The recorded information has been reviewed and is accurate.       Melissa Rhineonald Tamica Covell, MD 09/04/14 [phone]

## 2014-09-04 NOTE — Discharge Instructions (Signed)
Cough, Adult   A cough is a reflex. It helps you clear your throat and airways. A cough can help heal your body. A cough can last 2 or 3 weeks (acute) or may last more than 8 weeks (chronic). Some common causes of a cough can include an infection, allergy, or a cold.  HOME CARE  · Only take medicine as told by your doctor.  · If given, take your medicines (antibiotics) as told. Finish them even if you start to feel better.  · Use a cold steam vaporizer or humidifier in your home. This can help loosen thick spit (secretions).  · Sleep so you are almost sitting up (semi-upright). Use pillows to do this. This helps reduce coughing.  · Rest as needed.  · Stop smoking if you smoke.  GET HELP RIGHT AWAY IF:  · You have yellowish-white fluid (pus) in your thick spit.  · Your cough gets worse.  · Your medicine does not reduce coughing, and you are losing sleep.  · You cough up blood.  · You have trouble breathing.  · Your pain gets worse and medicine does not help.  · You have a fever.  MAKE SURE YOU:   · Understand these instructions.  · Will watch your condition.  · Will get help right away if you are not doing well or get worse.  Document Released: 01/02/2011 Document Revised: 09/05/2013 Document Reviewed: 01/02/2011  [employer]® Patient Information ©2015 [employer]. This information is not intended to replace advice given to you by your health care provider. Make sure you discuss any questions you have with your health care provider.

## 2014-09-04 NOTE — ED Notes (Signed)
C/o productive cough with clear sputum x 2-3 days.  States cough is worse at night when lying down.  Also reports vomiting that is induced by coughing so much.  Reports that she is [redacted] weeks pregnant.

## 2015-05-16 ENCOUNTER — Encounter (HOSPITAL_COMMUNITY): Payer: Self-pay | Admitting: *Deleted

## 2015-05-16 ENCOUNTER — Other Ambulatory Visit: Payer: Self-pay

## 2015-05-16 ENCOUNTER — Emergency Department (HOSPITAL_COMMUNITY): Payer: Medicaid Other

## 2015-05-16 ENCOUNTER — Emergency Department (HOSPITAL_COMMUNITY)
Admission: EM | Admit: 2015-05-16 | Discharge: 2015-05-16 | Disposition: A | Discharge status: Home / Self Care | Payer: Medicaid Other | Attending: Emergency Medicine | Admitting: Emergency Medicine

## 2015-05-16 DIAGNOSIS — R0602 Shortness of breath: Secondary | ICD-10-CM | POA: Diagnosis not present

## 2015-05-16 DIAGNOSIS — Z862 Personal history of diseases of the blood and blood-forming organs and certain disorders involving the immune mechanism: Secondary | ICD-10-CM | POA: Insufficient documentation

## 2015-05-16 DIAGNOSIS — R079 Chest pain, unspecified: Secondary | ICD-10-CM | POA: Diagnosis present

## 2015-05-16 LAB — BASIC METABOLIC PANEL
Anion gap: 9 (ref 5–15)
BUN: 7 mg/dL (ref 6–20)
CO2: 22 mmol/L (ref 22–32)
Calcium: 9.1 mg/dL (ref 8.9–10.3)
Chloride: 105 mmol/L (ref 101–111)
Creatinine, Ser: 0.74 mg/dL (ref 0.44–1.00)
GFR calc Af Amer: >60 mL/min (ref >60)
GLUCOSE: 97 mg/dL (ref 65–99)
POTASSIUM: 4.1 mmol/L (ref 3.5–5.1)
Sodium: 136 mmol/L (ref 135–145)

## 2015-05-16 LAB — CBC
HEMATOCRIT: 30.4 % — AB (ref 36.0–46.0)
Hemoglobin: 10 g/dL — ABNORMAL LOW (ref 12.0–15.0)
MCH: 20.9 pg — ABNORMAL LOW (ref 26.0–34.0)
MCHC: 32.9 g/dL (ref 30.0–36.0)
MCV: 63.6 fL — AB (ref 78.0–100.0)
Platelets: 345 10*3/uL (ref 150–400)
RBC: 4.78 MIL/uL (ref 3.87–5.11)
RDW: 19.1 % — AB (ref 11.5–15.5)
WBC: 13.3 10*3/uL — ABNORMAL HIGH (ref 4.0–10.5)

## 2015-05-16 LAB — I-STAT TROPONIN, ED
TROPONIN I, POC: 0 ng/mL (ref 0.00–0.08)
TROPONIN I, POC: 0 ng/mL (ref 0.00–0.08)

## 2015-05-16 MED ORDER — IBUPROFEN 800 MG PO TABS
800.0000 mg | ORAL_TABLET | Freq: Once | ORAL | Status: AC
  Administered 2015-05-16: 800 mg via ORAL
  Filled 2015-05-16: qty 1

## 2015-05-16 MED ORDER — IBUPROFEN 600 MG PO TABS: 600.0000 mg | ORAL_TABLET | Freq: Three times a day (TID) | ORAL | Status: DC | PRN

## 2015-05-16 NOTE — Discharge Instructions (Signed)

## 2015-05-16 NOTE — ED Notes (Signed)
Pt reports onset last night of left side chest pains and sob. Denies recent cough. ekg done at triage and no acute distress noted.

## 2015-05-16 NOTE — ED Notes (Signed)
Patient is alert and orientedx4.  Patient was explained discharge instructions and they understood them with no questions.   

## 2015-05-16 NOTE — ED Provider Notes (Signed)
CSN: [id number]     Arrival date & time 05/16/15  1419 History   First MD Initiated Contact with Patient 05/16/15 1756     Chief Complaint  Patient presents with  . Chest Pain  . Shortness of Breath    Patient is a 30 y.o. female presenting with chest pain and shortness of breath. The history is provided by the patient.  Chest Pain Pain location:  L chest and R chest Pain quality: aching (more on the left side) and tightness (both sides)   Pain radiates to:  Does not radiate Pain severity:  Moderate Onset quality:  Gradual Duration:  1 day Timing:  Constant Progression:  Worsening Chronicity:  New Relieved by:  Nothing Worsened by:  Exertion and movement Associated symptoms: shortness of breath   Associated symptoms: no cough, no fever and not vomiting   Shortness of Breath Associated symptoms: chest pain   Associated symptoms: no cough, no fever and no vomiting   Father died from heart troubles at age 107.  Past Medical History  Diagnosis Date  . Sickle cell trait (HCC)   . Anemia   . Postpartum care following cesarean delivery (09/22/12) 09/23/2012  . S/P repeat low transverse C-section 09/23/2012  . 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 Pregnant    Past Surgical History  Procedure Laterality Date  . Cesarean section  2005  . Cesarean section N/A 09/22/2012    Procedure: CESAREAN SECTION;  Surgeon: Serita Kyle, MD;  Location: [facility];  Service: Obstetrics;  Laterality: N/A;   Family History  Problem Relation Age of Onset  . Other Neg Hx   . Heart disease Father   . Anemia Mother    Social History  Substance Use Topics  . Smoking status: Never Smoker   . Smokeless tobacco: Never Used  . Alcohol Use: Yes     Comment: wine on ocassion   OB History    Gravida Para Term Preterm AB TAB SAB Ectopic Multiple Living   4 2 1  1 1  0   2     Review of Systems  Constitutional: Negative for fever.  Respiratory: Positive for shortness of breath. Negative for cough.    Cardiovascular: Positive for chest pain.  Gastrointestinal: Negative for vomiting.  All other systems reviewed and are negative.     Allergies  Shrimp  Home Medications   Prior to Admission medications   Medication Sig Start Date End Date Taking? Authorizing Provider  ibuprofen (ADVIL,MOTRIN) 600 MG tablet Take 1 tablet (600 mg total) by mouth every 8 (eight) hours as needed for moderate pain. 05/16/15   Linwood Dibbles, MD   BP 110/73 mmHg  Pulse 67  Temp(Src) 98.5 F (36.9 C) (Oral)  Resp 17  SpO2 100%  LMP 04/24/2015  Breastfeeding? Unknown Physical Exam  Constitutional: She appears well-developed and well-nourished. No distress.  HENT:  Head: Normocephalic and atraumatic.  Right Ear: External ear normal.  Left Ear: External ear normal.  Eyes: Conjunctivae are normal. Right eye exhibits no discharge. Left eye exhibits no discharge. No scleral icterus.  Neck: Neck supple. No tracheal deviation present.  Cardiovascular: Normal rate, regular rhythm and intact distal pulses.   Pulmonary/Chest: Effort normal and breath sounds normal. No stridor. No respiratory distress. She has no wheezes. She has no rales.  Abdominal: Soft. Bowel sounds are normal. She exhibits no distension. There is no tenderness. There is no rebound and no guarding.  Musculoskeletal: She exhibits no edema or tenderness.  Neurological: She is  alert. She has normal strength. No cranial nerve deficit (no facial droop, extraocular movements intact, no slurred speech) or sensory deficit. She exhibits normal muscle tone. She displays no seizure activity. Coordination normal.  Skin: Skin is warm and dry. No rash noted.  Psychiatric: She has a normal mood and affect.  Nursing note and vitals reviewed.   ED Course  Procedures (including critical care time) Labs Review Labs Reviewed  CBC - Abnormal; Notable for the following:    WBC 13.3 (*)    Hemoglobin 10.0 (*)    HCT 30.4 (*)    MCV 63.6 (*)    MCH 20.9 (*)     RDW 19.1 (*)    All other components within normal limits  BASIC METABOLIC PANEL  I-STAT TROPOININ, ED  Rosezena SensorI-STAT TROPOININ, ED    Imaging Review Dg Chest 2 View  05/16/2015  CLINICAL DATA:  Chest pain and shortness of breath. EXAM: CHEST  2 VIEW COMPARISON:  None. FINDINGS: The heart size and mediastinal contours are within normal limits. Both lungs are clear. The visualized skeletal structures are unremarkable. IMPRESSION: Normal chest. Electronically Signed   By: Francene BoyersJames  Maxwell M.D.   On: 05/16/2015 14:47   I have personally reviewed and evaluated these images and lab results as part of my medical decision-making.   EKG Interpretation   Date/Time:  Wednesday May 16 2015 14:18:55 EST Ventricular Rate:  81 PR Interval:  156 QRS Duration: 84 QT Interval:  356 QTC Calculation: 413 R Axis:   80 Text Interpretation:  Normal sinus rhythm Normal ECG No old tracing to  compare Confirmed by Jammi Morrissette  MD-J, Lucky Trotta ([id number]) on 05/16/2015 6:01:31 PM      MDM   Final diagnoses:  Chest pain, unspecified chest pain type    Low risk for heart disease.  Cardiac enzymes and ECG are normal.   Low risk for PE.  Perc negative. ?etiology of her pain but At this time there does not appear to be any evidence of an acute emergency medical condition and the patient appears stable for discharge with appropriate outpatient follow up.     Linwood DibblesJon Kaysee Hergert, MD 05/17/15 0005

## 2016-09-22 ENCOUNTER — Other Ambulatory Visit (HOSPITAL_COMMUNITY)
Admission: RE | Admit: 2016-09-22 | Discharge: 2016-09-22 | Disposition: A | Discharge status: Home / Self Care | Payer: Medicaid Other | Source: Ambulatory Visit | Attending: Obstetrics and Gynecology | Admitting: Obstetrics and Gynecology

## 2016-09-22 ENCOUNTER — Ambulatory Visit (INDEPENDENT_AMBULATORY_CARE_PROVIDER_SITE_OTHER): Payer: Medicaid Other | Admitting: Obstetrics and Gynecology

## 2016-09-22 ENCOUNTER — Encounter: Payer: Self-pay | Admitting: *Deleted

## 2016-09-22 ENCOUNTER — Encounter: Payer: Self-pay | Admitting: Obstetrics and Gynecology

## 2016-09-22 DIAGNOSIS — Z202 Contact with and (suspected) exposure to infections with a predominantly sexual mode of transmission: Secondary | ICD-10-CM | POA: Insufficient documentation

## 2016-09-22 DIAGNOSIS — Z0000 Encounter for general adult medical examination without abnormal findings: Secondary | ICD-10-CM

## 2016-09-22 DIAGNOSIS — Z308 Encounter for other contraceptive management: Secondary | ICD-10-CM

## 2016-09-22 DIAGNOSIS — Z01419 Encounter for gynecological examination (general) (routine) without abnormal findings: Secondary | ICD-10-CM

## 2016-09-22 MED ORDER — ETONOGESTREL-ETHINYL ESTRADIOL 0.12-0.015 MG/24HR VA RING: 1.0000 | VAGINAL_RING | VAGINAL | 11 refills | Status: DC

## 2016-09-22 NOTE — Progress Notes (Signed)
Patient is in the office for annual exam, last pap April 2017. Pt is requesting full screen STD testing for possible exposure. Pt states [facility] is [facility].

## 2016-09-22 NOTE — Addendum Note (Signed)
Addended by: Natale MilchSTALLING, BRITTANY D on: 09/22/2016 11:45 AM   Modules accepted: Orders

## 2016-09-22 NOTE — Patient Instructions (Signed)
Health Maintenance, Female Adopting a healthy lifestyle and getting preventive care can go a long way to promote health and wellness. Talk with your health care provider about what schedule of regular examinations is right for you. This is a good chance for you to check in with your provider about disease prevention and staying healthy. In between checkups, there are plenty of things you can do on your own. Experts have done a lot of research about which lifestyle changes and preventive measures are most likely to keep you healthy. Ask your health care provider for more information. Weight and diet Eat a healthy diet  Be sure to include plenty of vegetables, fruits, low-fat dairy products, and lean protein.  Do not eat a lot of foods high in solid fats, added sugars, or salt.  Get regular exercise. This is one of the most important things you can do for your health.  Most adults should exercise for at least 150 minutes each week. The exercise should increase your heart rate and make you sweat (moderate-intensity exercise).  Most adults should also do strengthening exercises at least twice a week. This is in addition to the moderate-intensity exercise. Maintain a healthy weight  Body mass index (BMI) is a measurement that can be used to identify possible weight problems. It estimates body fat based on height and weight. Your health care provider can help determine your BMI and help you achieve or maintain a healthy weight.  For females 76 years of age and older:  A BMI below 18.5 is considered underweight.  A BMI of 18.5 to 24.9 is normal.  A BMI of 25 to 29.9 is considered overweight.  A BMI of 30 and above is considered obese. Watch levels of cholesterol and blood lipids  You should start having your blood tested for lipids and cholesterol at 30 years of age, then have this test every 5 years.  You may need to have your cholesterol levels checked more often if:  Your lipid or  cholesterol levels are high.  You are older than 30 years of age.  You are at high risk for heart disease. Cancer screening Lung Cancer  Lung cancer screening is recommended for adults 64-42 years old who are at high risk for lung cancer because of a history of smoking.  A yearly low-dose CT scan of the lungs is recommended for people who:  Currently smoke.  Have quit within the past 15 years.  Have at least a 30-pack-year history of smoking. A pack year is smoking an average of one pack of cigarettes a day for 1 year.  Yearly screening should continue until it has been 15 years since you quit.  Yearly screening should stop if you develop a health problem that would prevent you from having lung cancer treatment. Breast Cancer  Practice breast self-awareness. This means understanding how your breasts normally appear and feel.  It also means doing regular breast self-exams. Let your health care provider know about any changes, no matter how small.  If you are in your 20s or 30s, you should have a clinical breast exam (CBE) by a health care provider every 1-3 years as part of a regular health exam.  If you are 34 or older, have a CBE every year. Also consider having a breast X-ray (mammogram) every year.  If you have a family history of breast cancer, talk to your health care provider about genetic screening.  If you are at high risk for breast cancer, talk  to your health care provider about having an MRI and a mammogram every year.  Breast cancer gene (BRCA) assessment is recommended for women who have family members with BRCA-related cancers. BRCA-related cancers include:  Breast.  Ovarian.  Tubal.  Peritoneal cancers.  Results of the assessment will determine the need for genetic counseling and BRCA1 and BRCA2 testing. Cervical Cancer  Your health care provider may recommend that you be screened regularly for cancer of the pelvic organs (ovaries, uterus, and vagina).  This screening involves a pelvic examination, including checking for microscopic changes to the surface of your cervix (Pap test). You may be encouraged to have this screening done every 3 years, beginning at age 24.  For women ages 66-65, health care providers may recommend pelvic exams and Pap testing every 3 years, or they may recommend the Pap and pelvic exam, combined with testing for human papilloma virus (HPV), every 5 years. Some types of HPV increase your risk of cervical cancer. Testing for HPV may also be done on women of any age with unclear Pap test results.  Other health care providers may not recommend any screening for nonpregnant women who are considered low risk for pelvic cancer and who do not have symptoms. Ask your health care provider if a screening pelvic exam is right for you.  If you have had past treatment for cervical cancer or a condition that could lead to cancer, you need Pap tests and screening for cancer for at least 20 years after your treatment. If Pap tests have been discontinued, your risk factors (such as having a new sexual partner) need to be reassessed to determine if screening should resume. Some women have medical problems that increase the chance of getting cervical cancer. In these cases, your health care provider may recommend more frequent screening and Pap tests. Colorectal Cancer  This type of cancer can be detected and often prevented.  Routine colorectal cancer screening usually begins at 30 years of age and continues through 30 years of age.  Your health care provider may recommend screening at an earlier age if you have risk factors for colon cancer.  Your health care provider may also recommend using home test kits to check for hidden blood in the stool.  A small camera at the end of a tube can be used to examine your colon directly (sigmoidoscopy or colonoscopy). This is done to check for the earliest forms of colorectal cancer.  Routine  screening usually begins at age 41.  Direct examination of the colon should be repeated every 5-10 years through 30 years of age. However, you may need to be screened more often if early forms of precancerous polyps or small growths are found. Skin Cancer  Check your skin from head to toe regularly.  Tell your health care provider about any new moles or changes in moles, especially if there is a change in a mole's shape or color.  Also tell your health care provider if you have a mole that is larger than the size of a pencil eraser.  Always use sunscreen. Apply sunscreen liberally and repeatedly throughout the day.  Protect yourself by wearing long sleeves, pants, a wide-brimmed hat, and sunglasses whenever you are outside. Heart disease, diabetes, and high blood pressure  High blood pressure causes heart disease and increases the risk of stroke. High blood pressure is more likely to develop in:  People who have blood pressure in the high end of the normal range (130-139/85-89 mm Hg).  People who are overweight or obese.  People who are African American.  If you are 59-24 years of age, have your blood pressure checked every 3-5 years. If you are 34 years of age or older, have your blood pressure checked every year. You should have your blood pressure measured twice-once when you are at a hospital or clinic, and once when you are not at a hospital or clinic. Record the average of the two measurements. To check your blood pressure when you are not at a hospital or clinic, you can use:  An automated blood pressure machine at a pharmacy.  A home blood pressure monitor.  If you are between 29 years and 60 years old, ask your health care provider if you should take aspirin to prevent strokes.  Have regular diabetes screenings. This involves taking a blood sample to check your fasting blood sugar level.  If you are at a normal weight and have a low risk for diabetes, have this test once  every three years after 30 years of age.  If you are overweight and have a high risk for diabetes, consider being tested at a younger age or more often. Preventing infection Hepatitis B  If you have a higher risk for hepatitis B, you should be screened for this virus. You are considered at high risk for hepatitis B if:  You were born in a country where hepatitis B is common. Ask your health care provider which countries are considered high risk.  Your parents were born in a high-risk country, and you have not been immunized against hepatitis B (hepatitis B vaccine).  You have HIV or AIDS.  You use needles to inject street drugs.  You live with someone who has hepatitis B.  You have had sex with someone who has hepatitis B.  You get hemodialysis treatment.  You take certain medicines for conditions, including cancer, organ transplantation, and autoimmune conditions. Hepatitis C  Blood testing is recommended for:  Everyone born from [birth year] through 1964.  Anyone with known risk factors for hepatitis C. Sexually transmitted infections (STIs)  You should be screened for sexually transmitted infections (STIs) including gonorrhea and chlamydia if:  You are sexually active and are younger than 30 years of age.  You are older than 30 years of age and your health care provider tells you that you are at risk for this type of infection.  Your sexual activity has changed since you were last screened and you are at an increased risk for chlamydia or gonorrhea. Ask your health care provider if you are at risk.  If you do not have HIV, but are at risk, it may be recommended that you take a prescription medicine daily to prevent HIV infection. This is called pre-exposure prophylaxis (PrEP). You are considered at risk if:  You are sexually active and do not regularly use condoms or know the HIV status of your partner(s).  You take drugs by injection.  You are sexually active with a partner  who has HIV. Talk with your health care provider about whether you are at high risk of being infected with HIV. If you choose to begin PrEP, you should first be tested for HIV. You should then be tested every 3 months for as long as you are taking PrEP. Pregnancy  If you are premenopausal and you may become pregnant, ask your health care provider about preconception counseling.  If you may become pregnant, take 400 to 800 micrograms (mcg) of folic acid  every day.  If you want to prevent pregnancy, talk to your health care provider about birth control (contraception). Osteoporosis and menopause  Osteoporosis is a disease in which the bones lose minerals and strength with aging. This can result in serious bone fractures. Your risk for osteoporosis can be identified using a bone density scan.  If you are 4 years of age or older, or if you are at risk for osteoporosis and fractures, ask your health care provider if you should be screened.  Ask your health care provider whether you should take a calcium or vitamin D supplement to lower your risk for osteoporosis.  Menopause may have certain physical symptoms and risks.  Hormone replacement therapy may reduce some of these symptoms and risks. Talk to your health care provider about whether hormone replacement therapy is right for you. Follow these instructions at home:  Schedule regular health, dental, and eye exams.  Stay current with your immunizations.  Do not use any tobacco products including cigarettes, chewing tobacco, or electronic cigarettes.  If you are pregnant, do not drink alcohol.  If you are breastfeeding, limit how much and how often you drink alcohol.  Limit alcohol intake to no more than 1 drink per day for nonpregnant women. One drink equals 12 ounces of beer, 5 ounces of wine, or 1 ounces of hard liquor.  Do not use street drugs.  Do not share needles.  Ask your health care provider for help if you need support  or information about quitting drugs.  Tell your health care provider if you often feel depressed.  Tell your health care provider if you have ever been abused or do not feel safe at home. This information is not intended to replace advice given to you by your health care provider. Make sure you discuss any questions you have with your health care provider. Document Released: 11/04/2010 Document Revised: 09/27/2015 Document Reviewed: 01/23/2015 Elsevier Interactive Patient Education  2017 [employer].

## 2016-09-22 NOTE — Addendum Note (Signed)
Addended by: Natale MilchSTALLING, Hasani Diemer D on: 09/22/2016 10:09 AM   Modules accepted: Orders

## 2016-09-22 NOTE — Progress Notes (Signed)
Melissa Joyce is a 31 y.o. [phone] female here for a routine annual gynecologic exam. She has no GYN complaints except she desires STD testing. Using NuvaRing for contraception. Sexual active with same partner x 5 years.    Denies abnormal vaginal bleeding, discharge, pelvic pain, problems with intercourse or other gynecologic concerns.    Gynecologic History Patient's last menstrual period was 08/06/2016. Contraception: NuvaRing vaginal inserts Last Pap: 2017. Results were: normal Last mammogram: NA. Results were: NA  Obstetric History OB History  Gravida Para Term Preterm AB Living  4 2 1   1 2   SAB TAB Ectopic Multiple Live Births  0 1     2    # Outcome Date GA Lbr Len/2nd Weight Sex Delivery Anes PTL Lv  4 Gravida           3 Term 09/22/12 [redacted]w[redacted]d  7 lb 10.6 oz (3.476 kg) F CS-LTranv EPI  LIV  2 TAB           1 Para     F CS-LTranv   LIV      Past Medical History:  Diagnosis Date  . Anemia   . 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 Postpartum care following cesarean delivery (09/22/12) 09/23/2012  . Pregnant   . S/P repeat low transverse C-section 09/23/2012  . Sickle cell trait [facility])     Past Surgical History:  Procedure Laterality Date  . CESAREAN SECTION  2005  . CESAREAN SECTION N/A 09/22/2012   Procedure: CESAREAN SECTION;  Surgeon: Serita Kyle, MD;  Location: [facility] ORS;  Service: Obstetrics;  Laterality: N/A;    Current Outpatient Prescriptions on File Prior to Visit  Medication Sig Dispense Refill  . ibuprofen (ADVIL,MOTRIN) 600 MG tablet Take 1 tablet (600 mg total) by mouth every 8 (eight) hours as needed for moderate pain. 30 tablet 0   No current facility-administered medications on file prior to visit.     Allergies  Allergen Reactions  . Shrimp [Shellfish Allergy] Hives    States only "steamed shrimp". Other ways of cooking shrimp are fine.     Social History   Social History  . Marital status: Legally Separated    Spouse name: N/A  . Number of  children: N/A  . Years of education: N/A   Occupational History  . Not on file.   Social History Main Topics  . Smoking status: Never Smoker  . Smokeless tobacco: Never Used  . Alcohol use Yes     Comment: wine on ocassion  . Drug use: No  . Sexual activity: Yes    Birth control/ protection: Inserts   Other Topics Concern  . Not on file   Social History Narrative  . No narrative on file    Family History  Problem Relation Age of Onset  . Heart disease Father   . Anemia Mother   . Cervical cancer Mother   . Cervical cancer Maternal Grandmother   . Ovarian cancer Other   . Other Neg Hx     The following portions of the patient's history were reviewed and updated as appropriate: allergies, current medications, past family history, past medical history, past social history, past surgical history and problem list.  Review of Systems Pertinent items noted in HPI and remainder of comprehensive ROS otherwise negative.   Objective:  BP 125/81   Pulse 81   Ht 5\' 1"  (1.549 m)   Wt 235 lb (106.6 kg)   LMP 08/06/2016   Breastfeeding? No  BMI 44.40 kg/m  CONSTITUTIONAL: Well-developed, well-nourished female in no acute distress.  HENT:  Normocephalic, atraumatic, External right and left ear normal. Oropharynx is clear and moist EYES: Conjunctivae and EOM are normal. Pupils are equal, round, and reactive to light. No scleral icterus.  NECK: Normal range of motion, supple, no masses.  Normal thyroid.  SKIN: Skin is warm and dry. No rash noted. Not diaphoretic. No erythema. No pallor. NEUROLGIC: Alert and oriented to person, place, and time. Normal reflexes, muscle tone coordination. No cranial nerve deficit noted. PSYCHIATRIC: Normal mood and affect. Normal behavior. Normal judgment and thought content. CARDIOVASCULAR: Normal heart rate noted, regular rhythm RESPIRATORY: Clear to auscultation bilaterally. Effort and breath sounds normal, no problems with respiration  noted. BREASTS: Symmetric in size. No masses, skin changes, nipple drainage, or lymphadenopathy. ABDOMEN: Soft, normal bowel sounds, no distention noted.  No tenderness, rebound or guarding.  PELVIC: Normal appearing external genitalia; normal appearing vaginal mucosa and cervix.  No abnormal discharge noted.  Pap smear obtained.  Normal uterine size, no other palpable masses, no uterine or adnexal tenderness. MUSCULOSKELETAL: Normal range of motion. No tenderness.  No cyanosis, clubbing, or edema.  2+ distal pulses.   Assessment:  Annual gynecologic examination with pap smear   Plan:  Will follow up results of pap smear and manage accordingly. STD as per pt request Continue with NuvaRing, RX sent to pharmacy Routine preventative health maintenance measures emphasized. Please refer to After Visit Summary for other counseling recommendations.    Hermina StaggersMichael L Nettie Wyffels, MD, FACOG Attending Obstetrician & Gynecologist Center [facility], [facility]

## 2016-09-23 LAB — CYTOLOGY - PAP
BACTERIAL VAGINITIS: POSITIVE — AB
CANDIDA VAGINITIS: NEGATIVE
Chlamydia: NEGATIVE
DIAGNOSIS: NEGATIVE
NEISSERIA GONORRHEA: NEGATIVE
Trichomonas: POSITIVE — AB

## 2016-09-24 LAB — HEPATITIS C ANTIBODY: HEP C VIRUS AB: 0.1 {s_co_ratio} (ref 0.0–0.9)

## 2016-09-24 LAB — HSV(HERPES SMPLX)ABS-I+II(IGG+IGM)-BLD
HSV 1 Glycoprotein G Ab, IgG: 17.2 index — ABNORMAL HIGH (ref 0.00–0.90)
HSV 2 Glycoprotein G Ab, IgG: 9.54 index — ABNORMAL HIGH (ref 0.00–0.90)
HSVI/II Comb IgM: <0.91 Ratio (ref 0.00–0.90)

## 2016-09-24 LAB — RPR: RPR Ser Ql: REACTIVE — AB

## 2016-09-24 LAB — RPR, QUANT+TP ABS (REFLEX): T Pallidum Abs: NEGATIVE

## 2016-09-24 LAB — HEPATITIS B SURFACE ANTIGEN: Hepatitis B Surface Ag: NEGATIVE

## 2016-09-24 LAB — HIV ANTIBODY (ROUTINE TESTING W REFLEX): HIV Screen 4th Generation wRfx: NONREACTIVE

## 2016-09-30 ENCOUNTER — Other Ambulatory Visit: Payer: Self-pay

## 2016-09-30 DIAGNOSIS — B9689 Other specified bacterial agents as the cause of diseases classified elsewhere: Secondary | ICD-10-CM

## 2016-09-30 DIAGNOSIS — A530 Latent syphilis, unspecified as early or late: Secondary | ICD-10-CM

## 2016-09-30 DIAGNOSIS — N760 Acute vaginitis: Principal | ICD-10-CM

## 2016-09-30 MED ORDER — METRONIDAZOLE 500 MG PO TABS: 500.0000 mg | ORAL_TABLET | Freq: Two times a day (BID) | ORAL | 0 refills | Status: AC

## 2016-09-30 NOTE — Progress Notes (Signed)
lagyl

## 2016-10-30 ENCOUNTER — Other Ambulatory Visit (HOSPITAL_COMMUNITY)
Admission: RE | Admit: 2016-10-30 | Discharge: 2016-10-30 | Disposition: A | Discharge status: Home / Self Care | Payer: Medicaid Other | Source: Ambulatory Visit | Attending: Obstetrics | Admitting: Obstetrics

## 2016-10-30 ENCOUNTER — Other Ambulatory Visit: Payer: Medicaid Other

## 2016-10-30 DIAGNOSIS — Z202 Contact with and (suspected) exposure to infections with a predominantly sexual mode of transmission: Secondary | ICD-10-CM

## 2016-10-30 NOTE — Progress Notes (Signed)
Patient is in the office for TOC, pt had positive Trich 09-22-16.

## 2016-10-31 LAB — CERVICOVAGINAL ANCILLARY ONLY
Bacterial vaginitis: NEGATIVE
CHLAMYDIA, DNA PROBE: NEGATIVE
Candida vaginitis: NEGATIVE
NEISSERIA GONORRHEA: NEGATIVE
Trichomonas: NEGATIVE

## 2016-11-21 ENCOUNTER — Encounter (HOSPITAL_COMMUNITY): Payer: Self-pay | Admitting: Emergency Medicine

## 2016-11-21 DIAGNOSIS — R21 Rash and other nonspecific skin eruption: Secondary | ICD-10-CM | POA: Diagnosis not present

## 2016-11-21 DIAGNOSIS — Z79899 Other long term (current) drug therapy: Secondary | ICD-10-CM | POA: Insufficient documentation

## 2016-11-21 NOTE — ED Triage Notes (Signed)
Patient reports generalized itchy skin rashes /hives onset this evening , respirations unlabored / airway intact with no oral swelling .

## 2016-11-22 ENCOUNTER — Emergency Department (HOSPITAL_COMMUNITY)
Admission: EM | Admit: 2016-11-22 | Discharge: 2016-11-22 | Disposition: A | Discharge status: Home / Self Care | Payer: Medicaid Other | Attending: Emergency Medicine | Admitting: Emergency Medicine

## 2016-11-22 DIAGNOSIS — R21 Rash and other nonspecific skin eruption: Secondary | ICD-10-CM

## 2016-11-22 MED ORDER — DIPHENHYDRAMINE HCL 25 MG PO CAPS
25.0000 mg | ORAL_CAPSULE | Freq: Once | ORAL | Status: AC
  Administered 2016-11-22: 25 mg via ORAL
  Filled 2016-11-22: qty 1

## 2016-11-22 MED ORDER — DEXAMETHASONE 4 MG PO TABS
8.0000 mg | ORAL_TABLET | Freq: Once | ORAL | Status: AC
  Administered 2016-11-22: 8 mg via ORAL
  Filled 2016-11-22: qty 2

## 2016-11-22 NOTE — ED Notes (Signed)
Pt departed in NAD, refused use of wheelchair.  

## 2016-11-22 NOTE — ED Provider Notes (Signed)
[facility]-EMERGENCY DEPT Provider Note   CSN: [id number] Arrival date & time: 11/21/16  2307  By signing my name below, I, Diona Browner, attest that this documentation has been prepared under the direction and in the presence of Zadie Rhine, MD. Electronically Signed: Diona Browner, ED Scribe. 11/22/16. 3:11 AM.  History   Chief Complaint Chief Complaint  Patient presents with  . Urticaria    HPI Melissa Joyce is a 31 y.o. female who presents to the Emergency Department complaining of generalized itchy hives that started in the evening of 11/21/16. Pt was sleeping when she woke up due to discomfort. Not sure what caused the rash. Nothing new or unusual. Only other time she had sx similar to onset was when she was around shellfish, which she was not around prior to onset. Pt doesn't work outside. No insect or tick bites. Pt denies fever, vomiting, trouble swallowing, difficulty breathing, diarrhea, and syncope.  The history is provided by the patient. No language interpreter was used.  Urticaria  The current episode started 3 to 5 hours ago. The problem occurs constantly. The problem has not changed since onset.Pertinent negatives include no chest pain, no abdominal pain, no headaches and no shortness of breath. Nothing aggravates the symptoms. Nothing relieves the symptoms. She has tried nothing for the symptoms. The treatment provided no relief.    Past Medical History:  Diagnosis Date  . Anemia   . 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 Postpartum care following cesarean delivery (09/22/12) 09/23/2012  . Pregnant   . S/P repeat low transverse C-section 09/23/2012  . Sickle cell trait [facility])     Patient Active Problem List   Diagnosis Date Noted  . Visit for routine gyn exam 09/22/2016  . Possible exposure to STD 09/22/2016  . Encounter for other contraceptive management 09/22/2016    Past Surgical History:  Procedure Laterality Date  . CESAREAN SECTION  2005  . CESAREAN SECTION N/A  09/22/2012   Procedure: CESAREAN SECTION;  Surgeon: Serita Kyle, MD;  Location: [facility] ORS;  Service: Obstetrics;  Laterality: N/A;    OB History    Gravida Para Term Preterm AB Living   4 2 1   1 2    SAB TAB Ectopic Multiple Live Births   0 1     2       Home Medications    Prior to Admission medications   Medication Sig Start Date End Date Taking? Authorizing Provider  etonogestrel-ethinyl estradiol (NUVARING) 0.12-0.015 MG/24HR vaginal ring Place 1 each vaginally every 28 (twenty-eight) days. Insert vaginally and leave in place for 3 consecutive weeks, then remove for 1 week. 09/22/16   Hermina Staggers, MD  ibuprofen (ADVIL,MOTRIN) 600 MG tablet Take 1 tablet (600 mg total) by mouth every 8 (eight) hours as needed for moderate pain. Patient not taking: Reported on 10/30/2016 05/16/15   Linwood Dibbles, MD    Family History Family History  Problem Relation Age of Onset  . Heart disease Father   . Anemia Mother   . Cervical cancer Mother   . Cervical cancer Maternal Grandmother   . Ovarian cancer Other   . Other Neg Hx     Social History Social History  Substance Use Topics  . Smoking status: Never Smoker  . Smokeless tobacco: Never Used  . Alcohol use Yes     Comment: wine on ocassion     Allergies   Shrimp [shellfish allergy]   Review of Systems Review of Systems  Constitutional: Negative for fever.  HENT: Negative for trouble swallowing.   Respiratory: Negative for shortness of breath.   Cardiovascular: Negative for chest pain.  Gastrointestinal: Negative for abdominal pain, diarrhea and vomiting.  Skin: Positive for rash.  Neurological: Negative for syncope and headaches.  All other systems reviewed and are negative.    Physical Exam Updated Vital Signs BP 117/76   Pulse 65   Temp 98.5 F (36.9 C) (Oral)   Resp 14   LMP 11/03/2016 (Exact Date)   SpO2 99%   Physical Exam  Nursing note and vitals reviewed.   CONSTITUTIONAL: Well developed/well  nourished HEAD: Normocephalic/atraumatic EYES: EOMI/PERRL ENMT: Mucous membranes moist, no angioedema  NECK: supple no meningeal signs SPINE/BACK:entire spine nontender CV: S1/S2 noted, no murmurs/rubs/gallops noted LUNGS: Lungs are clear to auscultation bilaterally, no apparent distress ABDOMEN: soft, nontender, no rebound or guarding, bowel sounds noted throughout abdomen GU:no cva tenderness NEURO: Pt is awake/alert/appropriate, moves all extremitiesx4.  No facial droop.   EXTREMITIES: pulses normal/equal, full ROM SKIN: warm, color normal, scattered rash to back and legs.  PSYCH: no abnormalities of mood noted, alert and oriented to situation  ED Treatments / Results  DIAGNOSTIC STUDIES: Oxygen Saturation is 99% on RA, normal by my interpretation.   COORDINATION OF CARE: 3:11 AM-Discussed next steps with pt which includes taking benadryl and a steroid. If sx worsen or change pt is to return to the ED. Pt verbalized understanding and is agreeable with the plan.   Labs (all labs ordered are listed, but only abnormal results are displayed) Labs Reviewed - No data to display  EKG  EKG Interpretation None       Radiology No results found.  Procedures Procedures (including critical care time)  Medications Ordered in ED Medications  diphenhydrAMINE (BENADRYL) capsule 25 mg (25 mg Oral Given 11/22/16 0302)  dexamethasone (DECADRON) tablet 8 mg (8 mg Oral Given 11/22/16 0332)     Initial Impression / Assessment and Plan / ED Course  I have reviewed the triage vital signs and the nursing notes.   Reaction limited to skin D/c home We discussed return precautions  Final Clinical Impressions(s) / ED Diagnoses   Final diagnoses:  Rash    New Prescriptions New Prescriptions   No medications on file   I personally performed the services described in this documentation, which was scribed in my presence. The recorded information has been reviewed and is accurate.        Zadie RhineWickline, Alanii Ramer, MD 11/22/16 0730

## 2016-11-25 ENCOUNTER — Encounter: Payer: Self-pay | Admitting: Internal Medicine

## 2016-11-25 ENCOUNTER — Ambulatory Visit (INDEPENDENT_AMBULATORY_CARE_PROVIDER_SITE_OTHER): Payer: Medicaid Other | Admitting: Internal Medicine

## 2016-11-25 VITALS — BP 127/88 | HR 76 | Temp 99.1°F | Wt 232.0 lb

## 2016-11-25 DIAGNOSIS — Z8619 Personal history of other infectious and parasitic diseases: Secondary | ICD-10-CM | POA: Diagnosis not present

## 2016-11-25 DIAGNOSIS — A530 Latent syphilis, unspecified as early or late: Secondary | ICD-10-CM | POA: Diagnosis present

## 2016-11-25 NOTE — Progress Notes (Signed)
RFV: referral for rpr +  Patient ID: Melissa Joyce, female   DOB: 01-22-1986, 31 y.o.   MRN: [id number]  HPI Malcolm is a 31yo F who has been involved in relationship for roughly 5 years. In may, she had concern that her significant other had stepped out of their relationship and went for STI testing. She was diagnosed with trichomonas and BV. She had her partner also get tested. Her RPR was positive but T.pallidum negative with RPR titer of 1.1. HIV testing is negative. HSV serology positive but does not recall ever having outbreak. She has not had any other partners x 5 yrs.  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 She has a 69 yo and 5 yo - kids.- healthy. Prenatal care for both kids. Not positive testing at those times. Outpatient Encounter Prescriptions as of 11/25/2016  Medication Sig  . etonogestrel-ethinyl estradiol (NUVARING) 0.12-0.015 MG/24HR vaginal ring Place 1 each vaginally every 28 (twenty-eight) days. Insert vaginally and leave in place for 3 consecutive weeks, then remove for 1 week.  Marland Kitchen ibuprofen (ADVIL,MOTRIN) 600 MG tablet Take 1 tablet (600 mg total) by mouth every 8 (eight) hours as needed for moderate pain. (Patient not taking: Reported on 10/30/2016)   No facility-administered encounter medications on file as of 11/25/2016.      Patient Active Problem List   Diagnosis Date Noted  . Visit for routine gyn exam 09/22/2016  . Possible exposure to STD 09/22/2016  . Encounter for other contraceptive management 09/22/2016     Health Maintenance Due  Topic Date Due  . TETANUS/TDAP  04/11/2006     Review of Systems Review of Systems  Constitutional: Negative for fever, chills, diaphoresis, activity change, appetite change, fatigue and unexpected weight change.  HENT: Negative for congestion, sore throat, rhinorrhea, sneezing, trouble swallowing and sinus pressure.  Eyes: Negative for photophobia and visual disturbance.  Respiratory: Negative for cough, chest  tightness, shortness of breath, wheezing and stridor.  Cardiovascular: Negative for chest pain, palpitations and leg swelling.  Gastrointestinal: Negative for nausea, vomiting, abdominal pain, diarrhea, constipation, blood in stool, abdominal distention and anal bleeding.  Genitourinary: Negative for dysuria, hematuria, flank pain and difficulty urinating.  Musculoskeletal: Negative for myalgias, back pain, joint swelling, arthralgias and gait problem.  Skin: Negative for color change, pallor, rash and wound.  Neurological: Negative for dizziness, tremors, weakness and light-headedness.  Hematological: Negative for adenopathy. Does not bruise/bleed easily.  Psychiatric/Behavioral: Negative for behavioral problems, confusion, sleep disturbance, dysphoric mood, decreased concentration and agitation.   Social History  Substance Use Topics  . Smoking status: Never Smoker  . Smokeless tobacco: Never Used  . Alcohol use Yes     Comment: wine on ocassion  family history includes Anemia in her mother; Cervical cancer in her maternal grandmother and mother; Heart disease in her father; Ovarian cancer in her other. There is no history of Other. Physical Exam   BP 127/88   Pulse 76   Temp 99.1 F (37.3 C) (Oral)   Wt 232 lb (105.2 kg)   LMP 11/03/2016 (Exact Date)   BMI 43.84 kg/m   Physical Exam  Constitutional:  oriented to person, place, and time. appears well-developed and well-nourished. No distress.  HENT: /AT, PERRLA, no scleral icterus Mouth/Throat: Oropharynx is clear and moist. No oropharyngeal exudate.  Cardiovascular: Normal rate, regular rhythm and normal heart sounds. Exam reveals no gallop and no friction rub.  No murmur heard.  Pulmonary/Chest: Effort normal and breath sounds normal. No respiratory distress.  has no wheezes.  Neck = supple, no nuchal rigidity Lymphadenopathy: no cervical adenopathy. No axillary adenopathy Neurological: alert and oriented to person, place,  and time.  Skin: Skin is warm and dry. No rash noted. No erythema.  Psychiatric: a normal mood and affect.  behavior is normal.   Lab Results  Component Value Date   LABRPR Reactive (A) 09/22/2016    CBC Lab Results  Component Value Date   WBC 13.3 (H) 05/16/2015   RBC 4.78 05/16/2015   HGB 10.0 (L) 05/16/2015   HCT 30.4 (L) 05/16/2015   PLT 345 05/16/2015   MCV 63.6 (L) 05/16/2015   MCH 20.9 (L) 05/16/2015   MCHC 32.9 05/16/2015   RDW 19.1 (H) 05/16/2015   LYMPHSABS 4.4 (H) 08/23/2014   MONOABS 0.8 08/23/2014   EOSABS 0.3 08/23/2014    BMET Lab Results  Component Value Date   NA 136 05/16/2015   K 4.1 05/16/2015   CL 105 05/16/2015   CO2 22 05/16/2015   GLUCOSE 97 05/16/2015   BUN 7 05/16/2015   CREATININE 0.74 05/16/2015   CALCIUM 9.1 05/16/2015   GFRNONAA >60 05/16/2015   GFRAA >60 05/16/2015      Assessment and Plan   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 Recommend to repeat testing to ensure not early exposure. She will come back for labwork. 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 Currently does not pose any problems for her. No need for suppression  Hx of trichomonas = she received treatment and repeat testing revealed cure.

## 2016-11-28 ENCOUNTER — Encounter (HOSPITAL_COMMUNITY): Payer: Self-pay | Admitting: Family Medicine

## 2016-11-28 ENCOUNTER — Ambulatory Visit (HOSPITAL_COMMUNITY)
Admission: EM | Admit: 2016-11-28 | Discharge: 2016-11-28 | Disposition: A | Discharge status: Home / Self Care | Payer: Worker's Compensation | Attending: Internal Medicine | Admitting: Internal Medicine

## 2016-11-28 DIAGNOSIS — S50861A Insect bite (nonvenomous) of right forearm, initial encounter: Secondary | ICD-10-CM | POA: Diagnosis not present

## 2016-11-28 DIAGNOSIS — W57XXXA Bitten or stung by nonvenomous insect and other nonvenomous arthropods, initial encounter: Secondary | ICD-10-CM

## 2016-11-28 MED ORDER — PREDNISONE 20 MG PO TABS: 40.0000 mg | ORAL_TABLET | Freq: Every day | ORAL | 0 refills | Status: AC

## 2016-11-28 NOTE — Discharge Instructions (Signed)
Start Prednisone as directed to help with swelling/inflammation. Continue Claritin to help with itchiness. You can use ice compress to help with itchiness as well. Cold/warm showers. Refrain from scratching/rubbing affected area. No signs of bacterial infection today, monitor for fever, increased redness/warmth/pain. Monitor for worsening of symptoms, swelling of the throat, trouble breathing, trouble swallowing, to go to the emergency department immediately.

## 2016-11-28 NOTE — ED Provider Notes (Signed)
CSN: [id number]     Arrival date & time 11/28/16  1656 History   None    Chief Complaint  Patient presents with  . Insect Bite   (Consider location/radiation/quality/duration/timing/severity/associated sxs/prior Treatment) 31 year old female comes in for insect bite on right forearm near the elbow that happened 4 hours ago. She works indoors. States she felt a bite but does not know what bit her. She states it is extremely itchy, and that the redness and itchiness has spread to the right upper arm as well as left forearm. She has tried hydrocortisone cream without relief. Denies trouble breathing, trouble swallowing, swelling of the throat. She is on daily Claritin. Denies fever, chills, night sweats. Denies chest pain, shortness of breath, wheezing.      Past Medical History:  Diagnosis Date  . Anemia   . 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 Postpartum care following cesarean delivery (09/22/12) 09/23/2012  . Pregnant   . S/P repeat low transverse C-section 09/23/2012  . Sickle cell trait [facility](HCC)    Past Surgical History:  Procedure Laterality Date  . CESAREAN SECTION  2005  . CESAREAN SECTION N/A 09/22/2012   Procedure: CESAREAN SECTION;  Surgeon: Serita KyleSheronette A Cousins, MD;  Location: [facility];  Service: Obstetrics;  Laterality: N/A;   Family History  Problem Relation Age of Onset  . Heart disease Father   . Anemia Mother   . Cervical cancer Mother   . Cervical cancer Maternal Grandmother   . Ovarian cancer Other   . Other Neg Hx    Social History  Substance Use Topics  . Smoking status: Never Smoker  . Smokeless tobacco: Never Used  . Alcohol use Yes     Comment: wine on ocassion   OB History    Gravida Para Term Preterm AB Living   4 2 1   1 2    SAB TAB Ectopic Multiple Live Births   0 1     2     Review of Systems  Reason unable to perform ROS: See HPI as above.    Allergies  Shrimp [shellfish allergy]  Home Medications   Prior to Admission medications   Medication  Sig Start Date End Date Taking? Authorizing Provider  etonogestrel-ethinyl estradiol (NUVARING) 0.12-0.015 MG/24HR vaginal ring Place 1 each vaginally every 28 (twenty-eight) days. Insert vaginally and leave in place for 3 consecutive weeks, then remove for 1 week. 09/22/16   Hermina StaggersErvin, Michael L, MD  ibuprofen (ADVIL,MOTRIN) 600 MG tablet Take 1 tablet (600 mg total) by mouth every 8 (eight) hours as needed for moderate pain. Patient not taking: Reported on 10/30/2016 05/16/15   Linwood DibblesKnapp, Jon, MD  predniSONE (DELTASONE) 20 MG tablet Take 2 tablets (40 mg total) by mouth daily. 11/28/16 12/03/16  Belinda FisherYu, Nyhla Mountjoy V, PA-C   Meds Ordered and Administered this Visit  Medications - No data to display  BP (!) 135/58   Pulse 77   Temp 98.4 F (36.9 C)   Resp 18   LMP 11/03/2016 (Exact Date)   SpO2 98%  No data found.   Physical Exam  Constitutional: She is oriented to person, place, and time. She appears well-developed and well-nourished. No distress.  HENT:  Mouth/Throat: Uvula is midline, oropharynx is clear and moist and mucous membranes are normal. No posterior oropharyngeal edema or posterior oropharyngeal erythema.  Cardiovascular: Normal rate, regular rhythm and normal heart sounds.  Exam reveals no gallop and no friction rub.   No murmur heard. Pulmonary/Chest: Effort normal and breath sounds normal. No  respiratory distress. She has no wheezes. She has no rales.  Neurological: She is alert and oriented to person, place, and time.  Skin: Skin is warm and dry.  No obvious bite wound of the right upper forearm. Mild erythema and swelling of the right forearm adjacent to the elbow, as well as right upper medial arm. Scratch marks seen on left forearm. No increased warmth  Psychiatric: She has a normal mood and affect. Her behavior is normal. Judgment normal.    Urgent Care Course     Procedures (including critical care time)  Labs Review Labs Reviewed - No data to display  Imaging Review No results  found.       MDM   1. Insect bite, initial encounter    Discussed with patient exam consistent with local reaction to insect bite. Given without swelling of the throat, trouble breathing, low suspicion for anaphylactic reaction. Prednisone 40mg  QD x 5 days to help with inflammation. Continue Claritin for itching and swelling. Patient to refrain from scratching/rubbing the area. Patient to monitor for worsening of symptoms, to follow up for further evaluation. Patient to monitor for trouble breathing, trouble swallowing, swelling of the throat, to go to the ED immediately.     Belinda FisherYu, Ridhaan Dreibelbis V, PA-C 11/28/16 1749

## 2016-11-28 NOTE — ED Triage Notes (Signed)
Pt here for allergic reaction to insect bite.

## 2017-11-23 ENCOUNTER — Encounter: Payer: Self-pay | Admitting: Obstetrics and Gynecology

## 2017-11-23 ENCOUNTER — Ambulatory Visit (INDEPENDENT_AMBULATORY_CARE_PROVIDER_SITE_OTHER): Payer: Managed Care, Other (non HMO) | Admitting: Obstetrics and Gynecology

## 2017-11-23 ENCOUNTER — Other Ambulatory Visit (HOSPITAL_COMMUNITY)
Admission: RE | Admit: 2017-11-23 | Discharge: 2017-11-23 | Disposition: A | Discharge status: Home / Self Care | Payer: Managed Care, Other (non HMO) | Source: Ambulatory Visit | Attending: Obstetrics and Gynecology | Admitting: Obstetrics and Gynecology

## 2017-11-23 VITALS — BP 114/78 | HR 84 | Wt 226.0 lb

## 2017-11-23 DIAGNOSIS — Z01419 Encounter for gynecological examination (general) (routine) without abnormal findings: Secondary | ICD-10-CM

## 2017-11-23 DIAGNOSIS — Z3202 Encounter for pregnancy test, result negative: Secondary | ICD-10-CM | POA: Diagnosis not present

## 2017-11-23 LAB — POCT URINE PREGNANCY: PREG TEST UR: NEGATIVE

## 2017-11-23 MED ORDER — SERTRALINE HCL 50 MG PO TABS: 50.0000 mg | ORAL_TABLET | Freq: Every day | ORAL | 5 refills | Status: DC

## 2017-11-23 NOTE — Addendum Note (Signed)
Addended by: Marya LandryFOSTER, Alister Staver D on: 11/23/2017 03:43 PM   Modules accepted: Orders

## 2017-11-23 NOTE — Progress Notes (Signed)
Subjective:     Melissa Joyce is a 32 y.o. female [phone] with  LMP 5/12 /2019and BMI 42 who is here for a comprehensive physical exam. The patient reports feeling depressed over the past year related to a bad break-up. She reports a normal monthly 6-day period but as been amenorrheic in June and July. She report a positive pregnancy test 2 weeks ago. She reports some nausea. She denies pelvic pain or abnormal discharge. Patient denies suicidal/homicidal ideations. She is seeing a counselor currently  Past Medical History:  Diagnosis Date  . Anemia   . 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 Postpartum care following cesarean delivery (09/22/12) 09/23/2012  . Pregnant   . S/P repeat low transverse C-section 09/23/2012  . Sickle cell trait [facility](HCC)    Past Surgical History:  Procedure Laterality Date  . CESAREAN SECTION  2005  . CESAREAN SECTION N/A 09/22/2012   Procedure: CESAREAN SECTION;  Surgeon: Serita KyleSheronette A Cousins, MD;  Location: [facility];  Service: Obstetrics;  Laterality: N/A;   Family History  Problem Relation Age of Onset  . Heart disease Father   . Anemia Mother   . Cervical cancer Mother   . Cervical cancer Maternal Grandmother   . Ovarian cancer Other   . Other Neg Hx     Social History   Socioeconomic History  . Marital status: Legally Separated    Spouse name: Not on file  . Number of children: Not on file  . Years of education: Not on file  . Highest education level: Not on file  Occupational History  . Not on file  Social Needs  . Financial resource strain: Not on file  . Food insecurity:    Worry: Not on file    Inability: Not on file  . Transportation needs:    Medical: Not on file    Non-medical: Not on file  Tobacco Use  . Smoking status: Never Smoker  . Smokeless tobacco: Never Used  Substance and Sexual Activity  . Alcohol use: Yes    Comment: wine on ocassion  . Drug use: No  . Sexual activity: Yes    Birth control/protection: Inserts  Lifestyle  . Physical  activity:    Days per week: Not on file    Minutes per session: Not on file  . Stress: Not on file  Relationships  . Social connections:    Talks on phone: Not on file    Gets together: Not on file    Attends religious service: Not on file    Active member of club or organization: Not on file    Attends meetings of clubs or organizations: Not on file    Relationship status: Not on file  . Intimate partner violence:    Fear of current or ex partner: Not on file    Emotionally abused: Not on file    Physically abused: Not on file    Forced sexual activity: Not on file  Other Topics Concern  . Not on file  Social History Narrative  . Not on file   Health Maintenance  Topic Date Due  . TETANUS/TDAP  04/11/2006  . INFLUENZA VACCINE  12/03/2017  . PAP SMEAR  09/23/2019  . HIV Screening  Completed       Review of Systems Pertinent items are noted in HPI.   Objective:      GENERAL: Well-developed, well-nourished female in no acute distress.  HEENT: Normocephalic, atraumatic. Sclerae anicteric.  NECK: Supple. Normal thyroid.  LUNGS: Clear to  auscultation bilaterally.  HEART: Regular rate and rhythm. BREASTS: Symmetric in size. No palpable masses or lymphadenopathy, skin changes, or nipple drainage. ABDOMEN: Soft, nontender, nondistended. No organomegaly. PELVIC: Normal external female genitalia. Vagina is pink and rugated.  Normal discharge. Normal appearing cervix. Uterus is normal in size. No adnexal mass or tenderness. EXTREMITIES: No cyanosis, clubbing, or edema, 2+ distal pulses.  Bedside transabdominal ultrasound- no intrauterine gestational sac visualized   Assessment:    Healthy female exam.      Plan:    Pap smear collected Patient with negative STI screen in May and June UPT- equivocal x2 Quant HCG ordered Patient will be contacted with results Patient plans on IUD for contraception and will return pending quant HCG Rx zoloft provided RTC in 1 month for  follow up on depression and IUD insertion  See After Visit Summary for Counseling Recommendations

## 2017-11-24 LAB — BETA HCG QUANT (REF LAB): hCG Quant: 7 m[IU]/mL

## 2017-11-25 LAB — CYTOLOGY - PAP
ADEQUACY: ABSENT
Diagnosis: NEGATIVE
HPV: NOT DETECTED

## 2017-11-26 ENCOUNTER — Other Ambulatory Visit: Payer: Self-pay

## 2017-12-21 ENCOUNTER — Ambulatory Visit: Payer: Self-pay | Admitting: Obstetrics and Gynecology

## 2018-02-17 ENCOUNTER — Emergency Department (HOSPITAL_COMMUNITY)
Admission: EM | Admit: 2018-02-17 | Discharge: 2018-02-17 | Disposition: A | Discharge status: Home / Self Care | Payer: Managed Care, Other (non HMO) | Attending: Emergency Medicine | Admitting: Emergency Medicine

## 2018-02-17 ENCOUNTER — Encounter: Payer: Self-pay | Admitting: Emergency Medicine

## 2018-02-17 DIAGNOSIS — Z79899 Other long term (current) drug therapy: Secondary | ICD-10-CM | POA: Insufficient documentation

## 2018-02-17 DIAGNOSIS — R55 Syncope and collapse: Secondary | ICD-10-CM | POA: Insufficient documentation

## 2018-02-17 DIAGNOSIS — F410 Panic disorder [episodic paroxysmal anxiety] without agoraphobia: Secondary | ICD-10-CM | POA: Insufficient documentation

## 2018-02-17 LAB — CBC
HEMATOCRIT: 34.6 % — AB (ref 36.0–46.0)
HEMOGLOBIN: 11 g/dL — AB (ref 12.0–15.0)
MCH: 22 pg — ABNORMAL LOW (ref 26.0–34.0)
MCHC: 31.8 g/dL (ref 30.0–36.0)
MCV: 69.1 fL — ABNORMAL LOW (ref 80.0–100.0)
NRBC: 0 % (ref 0.0–0.2)
Platelets: 391 10*3/uL (ref 150–400)
RBC: 5.01 MIL/uL (ref 3.87–5.11)
RDW: 17.6 % — ABNORMAL HIGH (ref 11.5–15.5)
WBC: 15.2 10*3/uL — AB (ref 4.0–10.5)

## 2018-02-17 LAB — COMPREHENSIVE METABOLIC PANEL
ALT: 17 U/L (ref 0–44)
ANION GAP: 11 (ref 5–15)
AST: 25 U/L (ref 15–41)
Albumin: 3.8 g/dL (ref 3.5–5.0)
Alkaline Phosphatase: 43 U/L (ref 38–126)
BILIRUBIN TOTAL: 0.2 mg/dL — AB (ref 0.3–1.2)
BUN: 8 mg/dL (ref 6–20)
CHLORIDE: 109 mmol/L (ref 98–111)
CO2: 21 mmol/L — AB (ref 22–32)
Calcium: 9.3 mg/dL (ref 8.9–10.3)
Creatinine, Ser: 0.86 mg/dL (ref 0.44–1.00)
GFR calc Af Amer: >60 mL/min (ref >60)
GFR calc non Af Amer: >60 mL/min (ref >60)
GLUCOSE: 86 mg/dL (ref 70–99)
POTASSIUM: 3.8 mmol/L (ref 3.5–5.1)
SODIUM: 141 mmol/L (ref 135–145)
TOTAL PROTEIN: 8.4 g/dL — AB (ref 6.5–8.1)

## 2018-02-17 LAB — POC URINE PREG, ED: PREG TEST UR: NEGATIVE

## 2018-02-17 LAB — D-DIMER, QUANTITATIVE (NOT AT ARMC): D DIMER QUANT: 0.37 ug{FEU}/mL (ref 0.00–0.50)

## 2018-02-17 LAB — I-STAT TROPONIN, ED: Troponin i, poc: 0 ng/mL (ref 0.00–0.08)

## 2018-02-17 MED ORDER — SODIUM CHLORIDE 0.9 % IV SOLN
Freq: Once | INTRAVENOUS | Status: AC
  Administered 2018-02-17: 18:00:00 via INTRAVENOUS

## 2018-02-17 MED ORDER — IBUPROFEN 800 MG PO TABS
800.0000 mg | ORAL_TABLET | Freq: Once | ORAL | Status: AC
  Administered 2018-02-17: 800 mg via ORAL
  Filled 2018-02-17: qty 1

## 2018-02-17 MED ORDER — LORAZEPAM 2 MG/ML IJ SOLN
1.0000 mg | Freq: Once | INTRAMUSCULAR | Status: AC
  Administered 2018-02-17: 1 mg via INTRAVENOUS
  Filled 2018-02-17: qty 1

## 2018-02-17 NOTE — ED Provider Notes (Signed)
[facility]-EMERGENCY DEPT Provider Note   CSN: [id number] Arrival date & time: 02/17/18  1622     History   Chief Complaint Chief Complaint  Patient presents with  . Panic Attack  . Loss of Consciousness    HPI Melissa Joyce is a 32 y.o. female recent miscarriage about a week ago and relationship issues presented with 2 to 3-hour history of chest tightness and chest pressure.  She rates the pain 9/10 with associated shortness of breath, palpitation, diaphoresis but denies nausea or vomiting.  She states that she had an episode of syncope with head trauma and possible postictal state but denies urinary or bowel incontinence, vigorous shaking of extremities.  She reports of numbness in the left arm and the left leg as well as left-sided headache and blurry vision.   Past Medical History:  Diagnosis Date  . Anemia   . 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 Postpartum care following cesarean delivery (09/22/12) 09/23/2012  . Pregnant   . S/P repeat low transverse C-section 09/23/2012  . Sickle cell trait [facility])     Patient Active Problem List   Diagnosis Date Noted  . Visit for routine gyn exam 09/22/2016  . Possible exposure to STD 09/22/2016  . Encounter for other contraceptive management 09/22/2016    Past Surgical History:  Procedure Laterality Date  . CESAREAN SECTION  2005  . CESAREAN SECTION N/A 09/22/2012   Procedure: CESAREAN SECTION;  Surgeon: Serita Kyle, MD;  Location: [facility] ORS;  Service: Obstetrics;  Laterality: N/A;     OB History    Gravida  4   Para  2   Term  1   Preterm      AB  1   Living  2     SAB  0   TAB  1   Ectopic      Multiple      Live Births  2            Home Medications    Prior to Admission medications   Medication Sig Start Date End Date Taking? Authorizing Provider  etonogestrel-ethinyl estradiol (NUVARING) 0.12-0.015 MG/24HR vaginal ring Place 1 each vaginally every 28 (twenty-eight) days.  Insert vaginally and leave in place for 3 consecutive weeks, then remove for 1 week. 09/22/16  Yes Hermina Staggers, MD  Multiple Vitamin (MULTIVITAMIN) tablet Take 1 tablet by mouth daily.   Yes [provider]  sertraline (ZOLOFT) 50 MG tablet Take 1 tablet (50 mg total) by mouth daily. 11/23/17  Yes Constant, Peggy, MD  Vitamin D, Ergocalciferol, (DRISDOL) 50000 units CAPS capsule Take 50,000 Units by mouth every 7 (seven) days. On Monday 01/10/18  Yes [provider]  ibuprofen (ADVIL,MOTRIN) 600 MG tablet Take 1 tablet (600 mg total) by mouth every 8 (eight) hours as needed for moderate pain. Patient not taking: Reported on 02/17/2018 05/16/15   Linwood Dibbles, MD    Family History Family History  Problem Relation Age of Onset  . Heart disease Father   . Anemia Mother   . Cervical cancer Mother   . Cervical cancer Maternal Grandmother   . Ovarian cancer Other   . Other Neg Hx     Social History Social History   Tobacco Use  . Smoking status: Never Smoker  . Smokeless tobacco: Never Used  Substance Use Topics  . Alcohol use: Not Currently    Comment: wine on ocassion  . Drug use: No     Allergies  Bee venom and Shrimp [shellfish allergy]   Review of Systems Review of Systems  Constitutional: Negative.   HENT: Negative.   Eyes: Positive for visual disturbance.  Respiratory: Positive for chest tightness and shortness of breath.   Cardiovascular: Positive for palpitations. Negative for chest pain.  Gastrointestinal: Negative.   Genitourinary: Negative.   Musculoskeletal: Negative.   Skin: Negative.   Neurological: Positive for syncope, numbness (left arm, left leg) and headaches (left sided).     Physical Exam Updated Vital Signs BP 117/76   Pulse 91   Temp 98.4 F (36.9 C) (Axillary)   Resp 17   Ht 5\' 1"  (1.549 m)   Wt 90.7 kg   SpO2 100%   BMI 37.79 kg/m   Physical Exam  Constitutional: She appears well-developed and well-nourished.    HENT:  Head: Normocephalic and atraumatic.  Eyes: Conjunctivae are normal.  Neck: Normal range of motion. Neck supple.  Cardiovascular: Tachycardia present. Exam reveals no gallop and no friction rub.  No murmur heard. Pulmonary/Chest: Effort normal and breath sounds normal. No respiratory distress. She has no wheezes.  Appeared to have increased work of breathing  Abdominal: Soft. Bowel sounds are normal.  Musculoskeletal: Normal range of motion.  Neurological: She is alert. No cranial nerve deficit or sensory deficit. Coordination normal.  Cranial nerve II-XII intact Focal neurological deficits  Skin: Skin is warm.  Psychiatric:  Appears anxious     ED Treatments / Results  Labs (all labs ordered are listed, but only abnormal results are displayed) Labs Reviewed  CBC - Abnormal; Notable for the following components:      Result Value   WBC 15.2 (*)    Hemoglobin 11.0 (*)    HCT 34.6 (*)    MCV 69.1 (*)    MCH 22.0 (*)    RDW 17.6 (*)    All other components within normal limits  COMPREHENSIVE METABOLIC PANEL - Abnormal; Notable for the following components:   CO2 21 (*)    Total Protein 8.4 (*)    Total Bilirubin 0.2 (*)    All other components within normal limits  D-DIMER, QUANTITATIVE (NOT AT [facility])  I-STAT TROPONIN, ED  POC URINE PREG, ED    EKG EKG Interpretation  Date/Time:  Wednesday February 17 2018 18:38:29 EDT Ventricular Rate:  85 PR Interval:    QRS Duration: 88 QT Interval:  361 QTC Calculation: 430 R Axis:   23 Text Interpretation:  Sinus rhythm Confirmed by Tilden Fossa [phone]) on 02/17/2018 7:02:21 PM   Radiology No results found.  Procedures Procedures (including critical care time)  Medications Ordered in ED Medications  0.9 %  sodium chloride infusion ( Intravenous New Bag/Given 02/17/18 1748)  LORazepam (ATIVAN) injection 1 mg (1 mg Intravenous Given 02/17/18 1749)  ibuprofen (ADVIL,MOTRIN) tablet 800 mg (800 mg Oral Given  02/17/18 1924)     Initial Impression / Assessment and Plan / ED Course  I have reviewed the triage vital signs and the nursing notes.  Pertinent labs & imaging results that were available during my care of the patient were reviewed by me and considered in my medical decision making (see chart for details).   32 year old woman with recent history of miscarriage and ongoing relationship issues presenting with an acute sudden onset chest tightness, chest pressure, shortness of breath, palpitation and diaphoresis as well as left upper and lower extremity numbness.  Also reports of left-sided headache with blurred vision.  While awake this morning, she had an episode  of syncope the onset of symptoms.  She states that she has been having flashbacks from her recent miscarriage.  On arrival she was hypertensive to 142/100 and tachypneic to 44 however SPO2 was 100% on ambient air.  On recheck BP was 97/80 (-->117/76), RR was 40, pulse is 90 SPO2 was 100% on ambient air.  EKG reviewed sinus rhythm, pregnancy test was negative, i-STAT troponin was unremarkable, CBC shows a chronic leukocytosis of unknown etiology and will need follow up with PCP.   She received Ativan 1 mg and normal saline fluid maintenance at 75 mL/Hr  On reevaluation she appeared very comfortable in bed and asking for food.   Final Clinical Impressions(s) / ED Diagnoses   Final diagnoses:  Panic attack    ED Discharge Orders    None       Yvette Rack, MD 02/17/18 2139    Tilden Fossa, MD 02/21/18 0700

## 2018-02-17 NOTE — ED Triage Notes (Signed)
PER EMS: Pt from work.  Pt at desk with sudden onset of anxiety attack.  No hx of anxiety, pt under more stress than normal.  Pt had miscarriage 1 week ago.  Pt had a syncopal episode in ambulance.  Pt refused IM versed in ambulance and refused an IV.  Pt c/o of chest tightness.

## 2018-02-17 NOTE — Discharge Instructions (Signed)
Ms. Nessel,   It was a pleasure taking care of you today. Your symptoms were most likely due to a panic attack. Your white blood cell count has been chronically high and will have to be followed up by your primary care doctor.   ~Take Care  Dr. Dortha Schwalbe

## 2018-02-26 ENCOUNTER — Other Ambulatory Visit: Payer: Self-pay | Admitting: Obstetrics and Gynecology

## 2019-07-07 LAB — OB RESULTS CONSOLE HIV ANTIBODY (ROUTINE TESTING): HIV: NONREACTIVE

## 2019-07-07 LAB — OB RESULTS CONSOLE RUBELLA ANTIBODY, IGM: Rubella: IMMUNE

## 2019-07-07 LAB — OB RESULTS CONSOLE HEPATITIS B SURFACE ANTIGEN: Hepatitis B Surface Ag: NEGATIVE

## 2019-07-13 ENCOUNTER — Other Ambulatory Visit: Payer: Self-pay | Admitting: Obstetrics and Gynecology

## 2019-07-22 ENCOUNTER — Encounter (HOSPITAL_COMMUNITY)
Admission: RE | Admit: 2019-07-22 | Discharge: 2019-07-22 | Disposition: A | Discharge status: Home / Self Care | Payer: Managed Care, Other (non HMO) | Source: Ambulatory Visit | Attending: Obstetrics and Gynecology | Admitting: Obstetrics and Gynecology

## 2019-07-22 ENCOUNTER — Other Ambulatory Visit: Payer: Self-pay

## 2019-07-22 DIAGNOSIS — Z3A00 Weeks of gestation of pregnancy not specified: Secondary | ICD-10-CM | POA: Diagnosis not present

## 2019-07-22 DIAGNOSIS — O99019 Anemia complicating pregnancy, unspecified trimester: Secondary | ICD-10-CM | POA: Insufficient documentation

## 2019-07-22 DIAGNOSIS — D509 Iron deficiency anemia, unspecified: Secondary | ICD-10-CM | POA: Diagnosis not present

## 2019-07-22 MED ORDER — SODIUM CHLORIDE 0.9 % IV SOLN
510.0000 mg | INTRAVENOUS | Status: DC
  Administered 2019-07-22: 510 mg via INTRAVENOUS
  Filled 2019-07-22: qty 17

## 2019-07-29 ENCOUNTER — Other Ambulatory Visit: Payer: Self-pay

## 2019-07-29 ENCOUNTER — Encounter (HOSPITAL_COMMUNITY)
Admission: RE | Admit: 2019-07-29 | Discharge: 2019-07-29 | Disposition: A | Discharge status: Home / Self Care | Payer: Managed Care, Other (non HMO) | Source: Ambulatory Visit | Attending: Obstetrics and Gynecology | Admitting: Obstetrics and Gynecology

## 2019-07-29 DIAGNOSIS — O99019 Anemia complicating pregnancy, unspecified trimester: Secondary | ICD-10-CM | POA: Diagnosis not present

## 2019-07-29 MED ORDER — SODIUM CHLORIDE 0.9 % IV SOLN
510.0000 mg | INTRAVENOUS | Status: DC
  Administered 2019-07-29: 510 mg via INTRAVENOUS
  Filled 2019-07-29: qty 17

## 2019-08-31 ENCOUNTER — Other Ambulatory Visit: Payer: Self-pay

## 2019-08-31 ENCOUNTER — Encounter: Payer: Self-pay | Admitting: Internal Medicine

## 2019-08-31 ENCOUNTER — Ambulatory Visit (INDEPENDENT_AMBULATORY_CARE_PROVIDER_SITE_OTHER): Payer: Managed Care, Other (non HMO) | Admitting: Internal Medicine

## 2019-08-31 VITALS — BP 100/76 | HR 95 | Temp 97.5°F | Ht 61.0 in | Wt 252.2 lb

## 2019-08-31 DIAGNOSIS — J4520 Mild intermittent asthma, uncomplicated: Secondary | ICD-10-CM | POA: Diagnosis not present

## 2019-08-31 DIAGNOSIS — O98912 Unspecified maternal infectious and parasitic disease complicating pregnancy, second trimester: Secondary | ICD-10-CM | POA: Diagnosis not present

## 2019-08-31 DIAGNOSIS — J301 Allergic rhinitis due to pollen: Secondary | ICD-10-CM

## 2019-08-31 MED ORDER — FLUTICASONE PROPIONATE 50 MCG/ACT NA SUSP: 1.0000 | Freq: Every day | NASAL | 2 refills | Status: DC

## 2019-08-31 MED ORDER — ALBUTEROL SULFATE HFA 108 (90 BASE) MCG/ACT IN AERS: 2.0000 | INHALATION_SPRAY | Freq: Four times a day (QID) | RESPIRATORY_TRACT | 5 refills | Status: DC | PRN

## 2019-08-31 MED ORDER — SALINE NASAL SPRAY 0.65 % NA SOLN: 1.0000 | NASAL | 12 refills | Status: DC | PRN

## 2019-08-31 MED ORDER — ALBUTEROL SULFATE (2.5 MG/3ML) 0.083% IN NEBU: 2.5000 mg | INHALATION_SOLUTION | Freq: Four times a day (QID) | RESPIRATORY_TRACT | 12 refills | Status: DC | PRN

## 2019-08-31 NOTE — Patient Instructions (Addendum)
Flonase - 1 spray on each side of your nose twice a day for first week, then 1 spray on each side.   Instructions for use:  If you also use a saline nasal spray or rinse, use that first.  Position the head with the chin slightly tucked. Use the right hand to spray into the left nostril and the right hand to spray into the left nostril.   Point the bottle away from the septum of your nose (cartilage that divides the two sides of your nose).   Hold the nostril closed on the opposite side from where you will spray  Spray once and gently sniff to pull the medicine into the higher parts of your nose.  Don't sniff too hard as the medicine will drain down the back of your throat instead.  Repeat with a second spray on the same side if prescribed.  Repeat on the other side of your nose.  Take the albuterol rescue inhaler every 4 to 6 hours as needed for wheezing or shortness of breath. You can also take it 15 minutes before exercise or exertional activity. Side effects include heart racing or pounding, jitters or anxiety. If you have a history of an irregular heart rhythm, it can make this worse. Can also give some patients a hard time sleeping.  To inhale the aerosol using an inhaler, follow these steps:  1. Remove the protective dust cap from the end of the mouthpiece. If the dust cap was not placed on the mouthpiece, check the mouthpiece for dirt or other objects. Be sure that the canister is fully and firmly inserted in the mouthpiece. 2. If you are using the inhaler for the first time or if you have not used the inhaler in more than 14 days, you will need to prime it. You may also need to prime the inhaler if it has been dropped. Ask your pharmacist or check the manufacturer's information if this happens. To prime the inhaler, shake it well and then press down on the canister 4 times to release 4 sprays into the air, away from your face. Be careful not to get albuterol in your eyes. 3. Shake the  inhaler well. 4. Breathe out as completely as possible through your mouth. 4. Hold the canister with the mouthpiece on the bottom, facing you and the canister pointing upward. Place the open end of the mouthpiece into your mouth. Close your lips tightly around the mouthpiece. 6. Breathe in slowly and deeply through the mouthpiece.At the same time, press down once on the container to spray the medication into your mouth. 7. Try to hold your breath for 10 seconds. remove the inhaler, and breathe out slowly. 8. If you were told to use 2 puffs, wait 1 minute and then repeat steps 3-7. 9. Replace the protective cap on the inhaler. 10. Clean your inhaler regularly. Follow the manufacturer's directions carefully and ask your doctor or pharmacist if you have any questions about cleaning your inhaler.  Check the back of the inhaler to keep track of the total number of doses left on the inhaler.

## 2019-08-31 NOTE — Progress Notes (Signed)
Melissa Joyce    [id number]    1986-03-26  Primary Care Physician:Patient, No Pcp Per  Referring Physician: Servando Salina, [place],  [place] [address] Reason for Consultation: shortness of breath Date of Consultation: 08/31/2019  Chief complaint:   Chief Complaint  Patient presents with  . Consult    sob with exertion the last 2 1/2 mos of pregnancy.  If gets too sob starts coughing.  Dry cough.     HPI: Melissa Joyce is a 34 y.o. woman, [redacted] weeks pregnant who presents for new patient consultation for shortness of breath. Worsening over the last 2 months. Shortness of breath worse with talking, walking, and laying on her left side. She doesn't go for walks anymore because of the breathing.  Seems to be getting worse as her belly is enlarging. Has had two previous pregnancies and hasn't had anything like this before. She is coughing at night when she lays on her left side. She is not having any reflux but has had it in the past with previous pregnancies.   She had allergies as a child but has never had asthma. She had "bronchitis: with her last pregnancy.  She takes cetirizine daily. She had a cough with her last pregnancy and she was given an inhaler which helped.   She is still having itchy, watery eyes runny nose, sore throat, especially early in the morning.   Social history: Occupation:accountaint, [employer] Smoking history: never smoker, no passive smoke exposure.   Social History   Occupational History  . Not on file  Tobacco Use  . Smoking status: Never Smoker  . Smokeless tobacco: Never Used  Substance and Sexual Activity  . Alcohol use: Not Currently    Comment: wine on ocassion  . Drug use: No  . Sexual activity: Yes    Relevant family history: Family History  Problem Relation Age of Onset  . Heart disease Father   . Anemia Mother   . Cervical cancer Mother   . Cervical cancer Maternal Grandmother   . Ovarian cancer Other   .  Asthma Sister   . Asthma Brother   . Asthma Daughter   . Other Neg Hx     Past Medical History:  Diagnosis Date  . Allergic rhinitis   . Anemia   . 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 Postpartum care following cesarean delivery (09/22/12) 09/23/2012  . Pregnant   . S/P repeat low transverse C-section 09/23/2012  . Sickle cell trait [facility])     Past Surgical History:  Procedure Laterality Date  . CESAREAN SECTION  2005  . CESAREAN SECTION N/A 09/22/2012   Procedure: CESAREAN SECTION;  Surgeon: Marvene Staff, MD;  Location: [facility];  Service: Obstetrics;  Laterality: N/A;     Review of systems: Review of Systems  Constitutional: Negative for chills, fever and weight loss.  HENT: Negative for congestion, sinus pain and sore throat.   Eyes: Negative for discharge and redness.  Respiratory: Positive for shortness of breath. Negative for cough, hemoptysis, sputum production and wheezing.   Cardiovascular: Negative for chest pain, palpitations and leg swelling.  Gastrointestinal: Negative for heartburn, nausea and vomiting.  Musculoskeletal: Negative for joint pain and myalgias.  Skin: Negative for rash.  Neurological: Negative for dizziness, tremors, focal weakness and headaches.  Endo/Heme/Allergies: Negative for environmental allergies.  Psychiatric/Behavioral: Negative for depression. The patient is not nervous/anxious.   All other systems reviewed and are negative.   Physical Exam:  Blood pressure 100/76, pulse 95, temperature (!) 97.5 F (36.4 C), temperature source Temporal, height 5\' 1"  (1.549 m), weight 252 lb 3.2 oz (114.4 kg), SpO2 95 %. Gen:      No acute distress ENT:   +mild nasal erythema, cobblestoning in the oropharynx, Mallampati 4 Lungs:    No increased respiratory effort, symmetric chest wall excursion, clear to auscultation bilaterally, no wheezes or crackles CV:         Regular rate and rhythm; no murmurs, rubs, or gallops.  No pedal edema Abd:      Gravid,  soft MSK: no acute synovitis of DIP or PIP joints, no mechanics hands.  Skin:      Warm and dry; no rashes Neuro: normal speech, no focal facial asymmetry Psych: alert and oriented x3, normal mood and affect    Data Reviewed/Medical Decision Making:  Independent interpretation of tests: Imaging: Chest x-ray from January 2017 personally reviewed.  No acute cardiopulmonary process.  PFTs: None on file  Labs: Reviewed from [facility] records - anticardiolipin ab negative.   Immunization status:  Immunization History  Administered Date(s) Administered  . Tdap 01/04/2013    . I reviewed prior external note(s) from Dr. 03/06/2013 . I reviewed the result(s) of the labs and imaging as noted above.  . I have ordered spirometry, FeNO   Assessment:  Shortness of Breath Allergic rhinitis Mild intermittent asthma Pregnancy in second trimester  Plan/Recommendations: [place] is a 34 year old woman with a history of bronchitis during pregnancy as well as seasonal allergies who presents with worsening shortness of breath.  I do believe that her symptoms are most likely related to mild intermittent asthma.  About a third of patients with underlying asthma have worsening symptoms during pregnancy.  We discussed the risks and benefits of control with medications and that ultimately what is safest for her pregnancy is what is safest for her.  We would want to obtain optimal control her asthma prior to her scheduled C-section.  We will start with albuterol nebulizer and MDI as needed.  I will obtain spirometry and exhaled nitric oxide testing.  Depending on her use we may consider controller medications with inhaled corticosteroid.  We will also start scheduled Flonase in addition to continuing her antihistamine for control of her rhinitis.  She does not feel like reflux is currently playing a role to her symptoms but will closely monitor this.  We discussed disease management and progression at length today.    Return to Care: Return in about 2 weeks (around 09/14/2019).  11/14/2019, MD Pulmonary and Critical Care Medicine [facility] Office:[phone]  CC: Durel Salts, MD

## 2019-09-13 ENCOUNTER — Ambulatory Visit: Payer: Managed Care, Other (non HMO) | Admitting: Internal Medicine

## 2019-10-13 ENCOUNTER — Other Ambulatory Visit: Payer: Self-pay

## 2019-10-13 ENCOUNTER — Encounter: Payer: Self-pay | Admitting: Adult Health

## 2019-10-13 ENCOUNTER — Ambulatory Visit (INDEPENDENT_AMBULATORY_CARE_PROVIDER_SITE_OTHER): Payer: Managed Care, Other (non HMO) | Admitting: Adult Health

## 2019-10-13 DIAGNOSIS — J301 Allergic rhinitis due to pollen: Secondary | ICD-10-CM | POA: Diagnosis not present

## 2019-10-13 DIAGNOSIS — J4520 Mild intermittent asthma, uncomplicated: Secondary | ICD-10-CM | POA: Diagnosis not present

## 2019-10-13 MED ORDER — PULMICORT FLEXHALER 90 MCG/ACT IN AEPB: 1.0000 | INHALATION_SPRAY | Freq: Two times a day (BID) | RESPIRATORY_TRACT | 3 refills | Status: DC

## 2019-10-13 MED ORDER — PREDNISONE 20 MG PO TABS: 40.0000 mg | ORAL_TABLET | Freq: Every day | ORAL | 0 refills | Status: DC

## 2019-10-13 NOTE — Patient Instructions (Addendum)
Begin prednisone 40 mg daily for 3 days. Begin Pulmicort 1 puff twice daily, rinse after use Continue on Zyrtec and Flonase daily Saline nasal rinses twice daily Follow-up in the office in 1 to 2 weeks and as needed Please contact office for sooner follow up if symptoms do not improve or worsen or seek emergency care

## 2019-10-13 NOTE — Progress Notes (Signed)
Virtual Visit via Telephone Note  I connected with Melissa Joyce on 10/13/19 at 10:30 AM EDT by telephone and verified that I am speaking with the correct person using two identifiers.  Location: Patient: Home  Provider: Home Office    I discussed the limitations, risks, security and privacy concerns of performing an evaluation and management service by telephone and the availability of in person appointments. I also discussed with the patient that there may be a patient responsible charge related to this service. The patient expressed understanding and agreed to proceed.   History of Present Illness: 34 yo female non smoker seen for pulmonary consult 08/31/19 for cough and shortness of breath . Patient is [redacted] weeks pregnant with EDD on 12/05/19 .   Today's televisit is a 6-week follow-up for asthma.  Patient was seen last visit for a pulmonary consult for cough and shortness of breath that has developed since being pregnant with her third child.  Patient complains of increased shortness of breath with activities nasal congestion and postnasal drainage.  Patient was started on Zyrtec and Flonase last visit.  She says she has not seen a significant improvement in symptoms continues to have shortness of breath with activities intermittent cough.  And wheezing.  She says she was at her OB for her routine well-baby checkup.  She says that her OB told her that she had wheezing.  She has been using albuterol without significant improvement She denies any GERD.  Has no occupational or environmental exposures or changes that she is aware of.  She is a never smoker. She denies any hemoptysis, chest pain, syncope, palpitations, calf pain. Has not had Covid vaccine  Patient Active Problem List   Diagnosis Date Noted  . Visit for routine gyn exam 09/22/2016  . Possible exposure to STD 09/22/2016  . Encounter for other contraceptive management 09/22/2016    Current Outpatient Medications on File Prior to Visit   Medication Sig Dispense Refill  . albuterol (PROVENTIL) (2.5 MG/3ML) 0.083% nebulizer solution Take 3 mLs (2.5 mg total) by nebulization every 6 (six) hours as needed for wheezing or shortness of breath. 75 mL 12  . albuterol (VENTOLIN HFA) 108 (90 Base) MCG/ACT inhaler Inhale 2 puffs into the lungs every 6 (six) hours as needed for wheezing or shortness of breath. 18 g 5  . fluticasone (FLONASE) 50 MCG/ACT nasal spray Place 1 spray into both nostrils daily. 16 g 2  . Prenatal Vit-Fe Fumarate-FA (PRENATAL MULTIVITAMIN) TABS tablet Take 1 tablet by mouth daily at 12 noon.    . sodium chloride (V-R NASAL SPRAY SALINE) 0.65 % nasal spray Place 1 spray into the nose as needed for congestion (use daily before fluticasone). 30 mL 12   No current facility-administered medications on file prior to visit.      Observations/Objective: Speaks in full sentences with no audible wheezing.  Patient does have positive cough during telemedicine visit  Assessment and Plan: Mild persistent asthma.  Patient continues to have increased symptom burden going into her third trimester. She is using albuterol on a regular basis without significant improvement.  We will give a very short course of prednisone.  Add in inhaled corticosteroid with Pulmicort.  Patient education on pregnancy categories for medications including prednisone and Pulmicort. Patient may use albuterol as needed.  Asthma action plan discussed.  Patient needs close follow-up in the office in 1 to 2 weeks  Plan  Patient Instructions  Begin prednisone 40 mg daily for 3 days. Begin Pulmicort  1 puff twice daily, rinse after use Continue on Zyrtec and Flonase daily Saline nasal rinses twice daily Follow-up in the office in 1 to 2 weeks and as needed Please contact office for sooner follow up if symptoms do not improve or worsen or seek emergency care       Follow Up Instructions: Follow-up in the office in 1 to 2 weeks and as needed   I  discussed the assessment and treatment plan with the patient. The patient was provided an opportunity to ask questions and all were answered. The patient agreed with the plan and demonstrated an understanding of the instructions.   The patient was advised to call back or seek an in-person evaluation if the symptoms worsen or if the condition fails to improve as anticipated.  I provided 26 minutes of non-face-to-face time during this encounter.   Rubye Oaks, NP

## 2019-10-21 ENCOUNTER — Other Ambulatory Visit: Payer: Self-pay

## 2019-10-21 ENCOUNTER — Ambulatory Visit (INDEPENDENT_AMBULATORY_CARE_PROVIDER_SITE_OTHER): Payer: Managed Care, Other (non HMO) | Admitting: Internal Medicine

## 2019-10-21 ENCOUNTER — Encounter: Payer: Self-pay | Admitting: Internal Medicine

## 2019-10-21 DIAGNOSIS — Z3A31 31 weeks gestation of pregnancy: Secondary | ICD-10-CM

## 2019-10-21 DIAGNOSIS — J4541 Moderate persistent asthma with (acute) exacerbation: Secondary | ICD-10-CM | POA: Diagnosis not present

## 2019-10-21 DIAGNOSIS — Z3490 Encounter for supervision of normal pregnancy, unspecified, unspecified trimester: Secondary | ICD-10-CM | POA: Insufficient documentation

## 2019-10-21 DIAGNOSIS — J4540 Moderate persistent asthma, uncomplicated: Secondary | ICD-10-CM | POA: Insufficient documentation

## 2019-10-21 MED ORDER — PREDNISONE 10 MG PO TABS: ORAL_TABLET | ORAL | 0 refills | Status: DC

## 2019-10-21 MED ORDER — PULMICORT FLEXHALER 90 MCG/ACT IN AEPB: INHALATION_SPRAY | RESPIRATORY_TRACT | 3 refills | Status: DC

## 2019-10-21 NOTE — Assessment & Plan Note (Signed)
No specific problems reported. She is in close touch with her OB-Gyn.

## 2019-10-21 NOTE — Assessment & Plan Note (Signed)
Current mild sustained exacerbation likely to ease with delivery date in early August. She has checked with her OB. Plan- prednisone for 5 days, allowing initiation of Pulmicort HFA. Discussed potential yeast- to be managed by her Ob prn.

## 2019-10-21 NOTE — Progress Notes (Signed)
10/21/19- 33 yoF never smoker patient of Dr Celine Mans for asthma, seen on 6/10 by NP for cough, wheezing. and SOB noted to develop since pregnant with 3rd child. [redacted] weeks pregnant. Worked in to see me today. At last visit Given short course prednisone, added pulmicort inhaler, continuing zyrtec, flonase, saline nasal rinses, neb albuterol Body weight today 253 lbs, arrival room air sat 98%,  Prednisone was helping, but she hadn't resolved wheezing before it ran out. He OB-GYN suggested she try a longer course. Pulmicort inhaler apparently didn't get called it.  Meds and asthma in pregnancy discussed.  She feels she is carrying this baby higher than her previous, leading to crowding of her breathing.  ROS-see HPI  + = positive Constitutional:    weight loss, night sweats, fevers, chills, fatigue, lassitude. HEENT:    headaches, difficulty swallowing, tooth/dental problems, sore throat,       sneezing, itching, ear ache, nasal congestion, post nasal drip, snoring CV:    chest pain, orthopnea, PND, swelling in lower extremities, anasarca,                                  dizziness, palpitations Resp:   shortness of breath with exertion or at rest.                productive cough,   +non-productive cough, coughing up of blood.              change in color of mucus.  +wheezing.   Skin:    rash or lesions. GI:  No-   heartburn, indigestion, abdominal pain, nausea, vomiting, diarrhea,                 change in bowel habits, loss of appetite GU: dysuria, change in color of urine, no urgency or frequency.   flank pain. MS:   joint pain, stiffness, decreased range of motion, back pain. Neuro-     nothing unusual Psych:  change in mood or affect.  depression or anxiety.   memory loss.  OBJ- Physical Exam General- Alert, Oriented, Affect-appropriate, Distress- none acute, + obese, + pregnant Skin- rash-none, lesions- none, excoriation- none Lymphadenopathy- none Head- atraumatic            Eyes- Gross  vision intact, PERRLA, conjunctivae and secretions clear            Ears- Hearing, canals-normal            Nose- Clear, no-Septal dev, mucus, polyps, erosion, perforation             Throat- Mallampati II , mucosa clear , drainage- none, tonsils- atrophic Neck- flexible , trachea midline, no stridor , thyroid nl, carotid no bruit Chest - symmetrical excursion , unlabored           Heart/CV- RRR , no murmur , no gallop  , no rub, nl s1 s2                           - JVD- none , edema- none, stasis changes- none, varices- none           Lung-  wheeze+ mild unlabored, cough- none , dullness-none, rub- none           Chest wall-  Abd-  Br/ Gen/ Rectal- Not done, not indicated Extrem- cyanosis- none, clubbing, none, atrophy- none, strength- nl Neuro- grossly intact  to observation

## 2019-10-21 NOTE — Patient Instructions (Signed)
Script sent for short prednisone course  Script sent for steroid inhaler (Pulmicort)-  Inhale 1 puff, then rinse mouth well, twice daily  Ok to use your albuterol inhaler and/ or nebulizer machine as before  Please call if we can help

## 2019-11-17 ENCOUNTER — Other Ambulatory Visit: Payer: Self-pay | Admitting: Obstetrics and Gynecology

## 2019-11-21 ENCOUNTER — Encounter (HOSPITAL_COMMUNITY): Payer: Self-pay | Admitting: *Deleted

## 2019-11-21 NOTE — Patient Instructions (Signed)
Melissa Joyce  11/21/2019   Your procedure is scheduled on:  12/02/2019  Arrive at 0530 at Entrance C on [address] at [facility]  and [employer]. You are invited to use the FREE valet parking or use the Visitor's parking deck.  Pick up the phone at the desk and dial [phone].  Call this number if you have problems the morning of surgery: [phone]  Remember:   Do not eat food:(After Midnight) Desps de medianoche.  Do not drink clear liquids: (After Midnight) Desps de medianoche.  Take these medicines the morning of surgery with A SIP OF WATER:  none   Do not wear jewelry, make-up or nail polish.  Do not wear lotions, powders, or perfumes. Do not wear deodorant.  Do not shave 48 hours prior to surgery.  Do not bring valuables to the hospital.  [facility] is not   responsible for any belongings or valuables brought to the hospital.  Contacts, dentures or bridgework may not be worn into surgery.  Leave suitcase in the car. After surgery it may be brought to your room.  For patients admitted to the hospital, checkout time is 11:00 AM the day of              discharge.      Please read over the following fact sheets that you were given:     Preparing for Surgery

## 2019-11-23 ENCOUNTER — Other Ambulatory Visit: Payer: Self-pay | Admitting: Obstetrics & Gynecology

## 2019-11-23 NOTE — Patient Instructions (Signed)
Melissa Joyce  11/23/2019   Your procedure is scheduled on:  12/03/2019  Arrive at 0730 at [employer] C on [address] at [facility]  and [employer]. You are invited to use the FREE valet parking or use the Visitor's parking deck.  Pick up the phone at the desk and dial [phone].  Call this number if you have problems the morning of surgery: [phone]  Remember:   Do not eat food:(After Midnight) Desps de medianoche.  Do not drink clear liquids: (After Midnight) Desps de medianoche.  Take these medicines the morning of surgery with A SIP OF WATER:  Bring our inhalers   Do not wear jewelry, make-up or nail polish.  Do not wear lotions, powders, or perfumes. Do not wear deodorant.  Do not shave 48 hours prior to surgery.  Do not bring valuables to the hospital.  [facility] is not   responsible for any belongings or valuables brought to the hospital.  Contacts, dentures or bridgework may not be worn into surgery.  Leave suitcase in the car. After surgery it may be brought to your room.  For patients admitted to the hospital, checkout time is 11:00 AM the day of              discharge.      Please read over the following fact sheets that you were given:     Preparing for Surgery

## 2019-11-30 ENCOUNTER — Other Ambulatory Visit (HOSPITAL_COMMUNITY)
Admission: RE | Admit: 2019-11-30 | Discharge: 2019-11-30 | Disposition: A | Discharge status: Home / Self Care | Payer: Managed Care, Other (non HMO) | Source: Ambulatory Visit | Attending: Obstetrics and Gynecology | Admitting: Obstetrics and Gynecology

## 2019-12-01 ENCOUNTER — Other Ambulatory Visit: Payer: Self-pay

## 2019-12-01 ENCOUNTER — Other Ambulatory Visit (HOSPITAL_COMMUNITY)
Admission: RE | Admit: 2019-12-01 | Discharge: 2019-12-01 | Disposition: A | Discharge status: Home / Self Care | Payer: Managed Care, Other (non HMO) | Source: Ambulatory Visit | Attending: Obstetrics and Gynecology | Admitting: Obstetrics and Gynecology

## 2019-12-01 DIAGNOSIS — Z01812 Encounter for preprocedural laboratory examination: Secondary | ICD-10-CM | POA: Insufficient documentation

## 2019-12-01 DIAGNOSIS — Z20822 Contact with and (suspected) exposure to covid-19: Secondary | ICD-10-CM | POA: Insufficient documentation

## 2019-12-01 LAB — RPR
RPR Ser Ql: REACTIVE — AB
RPR Titer: 1:1 {titer}

## 2019-12-01 LAB — SARS CORONAVIRUS 2 (TAT 6-24 HRS): SARS Coronavirus 2: NEGATIVE

## 2019-12-01 LAB — CBC
HCT: 39.4 % (ref 36.0–46.0)
Hemoglobin: 13.4 g/dL (ref 12.0–15.0)
MCH: 27.4 pg (ref 26.0–34.0)
MCHC: 34 g/dL (ref 30.0–36.0)
MCV: 80.6 fL (ref 80.0–100.0)
Platelets: 233 10*3/uL (ref 150–400)
RBC: 4.89 MIL/uL (ref 3.87–5.11)
RDW: 14.6 % (ref 11.5–15.5)
WBC: 12 10*3/uL — ABNORMAL HIGH (ref 4.0–10.5)
nRBC: 0 % (ref 0.0–0.2)

## 2019-12-03 ENCOUNTER — Inpatient Hospital Stay (HOSPITAL_COMMUNITY)
Admission: RE | Admit: 2019-12-03 | Discharge: 2019-12-07 | DRG: 787 | Disposition: A | Discharge status: Home / Self Care | Payer: Managed Care, Other (non HMO) | Attending: Obstetrics & Gynecology | Admitting: Obstetrics & Gynecology

## 2019-12-03 ENCOUNTER — Encounter (HOSPITAL_COMMUNITY)
Admission: RE | Disposition: A | Discharge status: Home / Self Care | Payer: Self-pay | Source: Home / Self Care | Attending: Obstetrics & Gynecology

## 2019-12-03 ENCOUNTER — Inpatient Hospital Stay (HOSPITAL_COMMUNITY): Payer: Managed Care, Other (non HMO) | Admitting: Anesthesiology

## 2019-12-03 ENCOUNTER — Other Ambulatory Visit: Payer: Self-pay

## 2019-12-03 ENCOUNTER — Encounter (HOSPITAL_COMMUNITY): Payer: Self-pay | Admitting: Obstetrics & Gynecology

## 2019-12-03 DIAGNOSIS — R768 Other specified abnormal immunological findings in serum: Secondary | ICD-10-CM | POA: Diagnosis present

## 2019-12-03 DIAGNOSIS — J4540 Moderate persistent asthma, uncomplicated: Secondary | ICD-10-CM | POA: Diagnosis present

## 2019-12-03 DIAGNOSIS — O99214 Obesity complicating childbirth: Secondary | ICD-10-CM | POA: Diagnosis present

## 2019-12-03 DIAGNOSIS — R0602 Shortness of breath: Secondary | ICD-10-CM

## 2019-12-03 DIAGNOSIS — Z3A39 39 weeks gestation of pregnancy: Secondary | ICD-10-CM

## 2019-12-03 DIAGNOSIS — Z20822 Contact with and (suspected) exposure to covid-19: Secondary | ICD-10-CM | POA: Diagnosis present

## 2019-12-03 DIAGNOSIS — O9952 Diseases of the respiratory system complicating childbirth: Secondary | ICD-10-CM | POA: Diagnosis present

## 2019-12-03 DIAGNOSIS — D62 Acute posthemorrhagic anemia: Secondary | ICD-10-CM | POA: Diagnosis not present

## 2019-12-03 DIAGNOSIS — O99824 Streptococcus B carrier state complicating childbirth: Secondary | ICD-10-CM | POA: Diagnosis present

## 2019-12-03 DIAGNOSIS — O9081 Anemia of the puerperium: Secondary | ICD-10-CM | POA: Diagnosis not present

## 2019-12-03 DIAGNOSIS — O34211 Maternal care for low transverse scar from previous cesarean delivery: Principal | ICD-10-CM | POA: Diagnosis present

## 2019-12-03 DIAGNOSIS — Z98891 History of uterine scar from previous surgery: Secondary | ICD-10-CM

## 2019-12-03 DIAGNOSIS — O9902 Anemia complicating childbirth: Secondary | ICD-10-CM

## 2019-12-03 DIAGNOSIS — D573 Sickle-cell trait: Secondary | ICD-10-CM | POA: Diagnosis present

## 2019-12-03 SURGERY — Surgical Case
Anesthesia: Epidural | Wound class: Clean Contaminated

## 2019-12-03 MED ORDER — ONDANSETRON HCL 4 MG/2ML IJ SOLN
4.0000 mg | Freq: Three times a day (TID) | INTRAMUSCULAR | Status: DC | PRN
  Administered 2019-12-04 (×2): 4 mg via INTRAVENOUS
  Filled 2019-12-03 (×2): qty 2

## 2019-12-03 MED ORDER — MEPERIDINE HCL 25 MG/ML IJ SOLN: 6.2500 mg | INTRAMUSCULAR | Status: DC | PRN

## 2019-12-03 MED ORDER — SODIUM CHLORIDE 0.9% FLUSH: 3.0000 mL | INTRAVENOUS | Status: DC | PRN

## 2019-12-03 MED ORDER — TETANUS-DIPHTH-ACELL PERTUSSIS 5-2.5-18.5 LF-MCG/0.5 IM SUSP: 0.5000 mL | Freq: Once | INTRAMUSCULAR | Status: DC

## 2019-12-03 MED ORDER — OXYTOCIN-SODIUM CHLORIDE 30-0.9 UT/500ML-% IV SOLN
INTRAVENOUS | Status: AC
  Filled 2019-12-03: qty 500

## 2019-12-03 MED ORDER — MENTHOL 3 MG MT LOZG: 1.0000 | LOZENGE | OROMUCOSAL | Status: DC | PRN

## 2019-12-03 MED ORDER — OXYCODONE HCL 5 MG/5ML PO SOLN: 5.0000 mg | Freq: Once | ORAL | Status: DC | PRN

## 2019-12-03 MED ORDER — DIBUCAINE (PERIANAL) 1 % EX OINT: 1.0000 "application " | TOPICAL_OINTMENT | CUTANEOUS | Status: DC | PRN

## 2019-12-03 MED ORDER — NALOXONE HCL 4 MG/10ML IJ SOLN
1.0000 ug/kg/h | INTRAVENOUS | Status: DC | PRN
  Filled 2019-12-03: qty 5

## 2019-12-03 MED ORDER — SIMETHICONE 80 MG PO CHEW
80.0000 mg | CHEWABLE_TABLET | ORAL | Status: DC | PRN
  Administered 2019-12-07: 80 mg via ORAL
  Filled 2019-12-03: qty 1

## 2019-12-03 MED ORDER — KETOROLAC TROMETHAMINE 30 MG/ML IJ SOLN
INTRAMUSCULAR | Status: AC
  Filled 2019-12-03: qty 1

## 2019-12-03 MED ORDER — DEXMEDETOMIDINE (PRECEDEX) IN NS 20 MCG/5ML (4 MCG/ML) IV SYRINGE
PREFILLED_SYRINGE | INTRAVENOUS | Status: DC | PRN
  Administered 2019-12-03 (×2): 4 ug via INTRAVENOUS

## 2019-12-03 MED ORDER — KETOROLAC TROMETHAMINE 30 MG/ML IJ SOLN
30.0000 mg | Freq: Once | INTRAMUSCULAR | Status: AC | PRN
  Administered 2019-12-03: 30 mg via INTRAVENOUS

## 2019-12-03 MED ORDER — FENTANYL CITRATE (PF) 100 MCG/2ML IJ SOLN
INTRAMUSCULAR | Status: AC
  Filled 2019-12-03: qty 2

## 2019-12-03 MED ORDER — BUPIVACAINE IN DEXTROSE 0.75-8.25 % IT SOLN
INTRATHECAL | Status: DC | PRN
  Administered 2019-12-03: 1.8 mL via INTRATHECAL

## 2019-12-03 MED ORDER — PHENYLEPHRINE HCL-NACL 20-0.9 MG/250ML-% IV SOLN
INTRAVENOUS | Status: AC
  Filled 2019-12-03: qty 250

## 2019-12-03 MED ORDER — FENTANYL CITRATE (PF) 100 MCG/2ML IJ SOLN: 25.0000 ug | INTRAMUSCULAR | Status: DC | PRN

## 2019-12-03 MED ORDER — ONDANSETRON HCL 4 MG/2ML IJ SOLN
INTRAMUSCULAR | Status: AC
  Filled 2019-12-03: qty 2

## 2019-12-03 MED ORDER — PRENATAL MULTIVITAMIN CH
1.0000 | ORAL_TABLET | Freq: Every day | ORAL | Status: DC
  Administered 2019-12-04 – 2019-12-07 (×3): 1 via ORAL
  Filled 2019-12-03 (×4): qty 1

## 2019-12-03 MED ORDER — DIPHENHYDRAMINE HCL 25 MG PO CAPS: 25.0000 mg | ORAL_CAPSULE | Freq: Four times a day (QID) | ORAL | Status: DC | PRN

## 2019-12-03 MED ORDER — DIPHENHYDRAMINE HCL 25 MG PO CAPS: 25.0000 mg | ORAL_CAPSULE | ORAL | Status: DC | PRN

## 2019-12-03 MED ORDER — OXYTOCIN-SODIUM CHLORIDE 30-0.9 UT/500ML-% IV SOLN
INTRAVENOUS | Status: DC | PRN
  Administered 2019-12-03: 30 [IU] via INTRAVENOUS

## 2019-12-03 MED ORDER — ONDANSETRON HCL 4 MG/2ML IJ SOLN: 4.0000 mg | Freq: Once | INTRAMUSCULAR | Status: DC | PRN

## 2019-12-03 MED ORDER — LACTATED RINGERS IV SOLN: INTRAVENOUS | Status: DC

## 2019-12-03 MED ORDER — ACETAMINOPHEN 10 MG/ML IV SOLN: 1000.0000 mg | Freq: Once | INTRAVENOUS | Status: DC | PRN

## 2019-12-03 MED ORDER — ONDANSETRON HCL 4 MG/2ML IJ SOLN
INTRAMUSCULAR | Status: DC | PRN
  Administered 2019-12-03 (×2): 4 mg via INTRAVENOUS

## 2019-12-03 MED ORDER — NALOXONE HCL 0.4 MG/ML IJ SOLN: 0.4000 mg | INTRAMUSCULAR | Status: DC | PRN

## 2019-12-03 MED ORDER — MORPHINE SULFATE (PF) 0.5 MG/ML IJ SOLN
INTRAMUSCULAR | Status: DC | PRN
  Administered 2019-12-03: .15 mg via INTRATHECAL

## 2019-12-03 MED ORDER — WITCH HAZEL-GLYCERIN EX PADS: 1.0000 "application " | MEDICATED_PAD | CUTANEOUS | Status: DC | PRN

## 2019-12-03 MED ORDER — POVIDONE-IODINE 10 % EX SWAB: 2.0000 "application " | Freq: Once | CUTANEOUS | Status: DC

## 2019-12-03 MED ORDER — SIMETHICONE 80 MG PO CHEW
80.0000 mg | CHEWABLE_TABLET | ORAL | Status: DC
  Administered 2019-12-04 – 2019-12-07 (×4): 80 mg via ORAL
  Filled 2019-12-03 (×4): qty 1

## 2019-12-03 MED ORDER — DIPHENHYDRAMINE HCL 50 MG/ML IJ SOLN: 12.5000 mg | INTRAMUSCULAR | Status: DC | PRN

## 2019-12-03 MED ORDER — OXYTOCIN-SODIUM CHLORIDE 30-0.9 UT/500ML-% IV SOLN
2.5000 [IU]/h | INTRAVENOUS | Status: AC
  Administered 2019-12-03: 2.5 [IU]/h via INTRAVENOUS
  Filled 2019-12-03: qty 500

## 2019-12-03 MED ORDER — STERILE WATER FOR IRRIGATION IR SOLN
Status: DC | PRN
  Administered 2019-12-03: 1

## 2019-12-03 MED ORDER — MORPHINE SULFATE (PF) 0.5 MG/ML IJ SOLN
INTRAMUSCULAR | Status: AC
  Filled 2019-12-03: qty 10

## 2019-12-03 MED ORDER — DEXAMETHASONE SODIUM PHOSPHATE 4 MG/ML IJ SOLN
INTRAMUSCULAR | Status: DC | PRN
  Administered 2019-12-03: 4 mg via INTRAVENOUS

## 2019-12-03 MED ORDER — SIMETHICONE 80 MG PO CHEW
80.0000 mg | CHEWABLE_TABLET | Freq: Three times a day (TID) | ORAL | Status: DC
  Administered 2019-12-03 – 2019-12-07 (×10): 80 mg via ORAL
  Filled 2019-12-03 (×11): qty 1

## 2019-12-03 MED ORDER — ALBUTEROL SULFATE (2.5 MG/3ML) 0.083% IN NEBU
2.5000 mg | INHALATION_SOLUTION | Freq: Four times a day (QID) | RESPIRATORY_TRACT | Status: DC | PRN
  Administered 2019-12-04: 2.5 mg via RESPIRATORY_TRACT
  Filled 2019-12-03 (×2): qty 3

## 2019-12-03 MED ORDER — OXYCODONE HCL 5 MG PO TABS
5.0000 mg | ORAL_TABLET | Freq: Four times a day (QID) | ORAL | Status: DC | PRN
  Administered 2019-12-04: 5 mg via ORAL
  Filled 2019-12-03 (×2): qty 1

## 2019-12-03 MED ORDER — IBUPROFEN 600 MG PO TABS
600.0000 mg | ORAL_TABLET | Freq: Four times a day (QID) | ORAL | Status: DC | PRN
  Administered 2019-12-04 – 2019-12-06 (×5): 600 mg via ORAL
  Filled 2019-12-03 (×5): qty 1

## 2019-12-03 MED ORDER — CEFAZOLIN SODIUM-DEXTROSE 2-3 GM-%(50ML) IV SOLR
INTRAVENOUS | Status: DC | PRN
  Administered 2019-12-03: 2 g via INTRAVENOUS

## 2019-12-03 MED ORDER — CEFAZOLIN SODIUM-DEXTROSE 2-4 GM/100ML-% IV SOLN
INTRAVENOUS | Status: AC
  Filled 2019-12-03: qty 100

## 2019-12-03 MED ORDER — OXYCODONE HCL 5 MG PO TABS: 5.0000 mg | ORAL_TABLET | Freq: Once | ORAL | Status: DC | PRN

## 2019-12-03 MED ORDER — LACTATED RINGERS IV SOLN
INTRAVENOUS | Status: DC | PRN
Start: 2019-12-03 — End: 2019-12-03

## 2019-12-03 MED ORDER — SENNOSIDES-DOCUSATE SODIUM 8.6-50 MG PO TABS
2.0000 | ORAL_TABLET | ORAL | Status: DC
  Administered 2019-12-04 – 2019-12-07 (×4): 2 via ORAL
  Filled 2019-12-03 (×4): qty 2

## 2019-12-03 MED ORDER — PHENYLEPHRINE HCL-NACL 20-0.9 MG/250ML-% IV SOLN
INTRAVENOUS | Status: DC | PRN
  Administered 2019-12-03: 60 ug/min via INTRAVENOUS

## 2019-12-03 MED ORDER — NALBUPHINE HCL 10 MG/ML IJ SOLN: 5.0000 mg | INTRAMUSCULAR | Status: DC | PRN

## 2019-12-03 MED ORDER — SODIUM CHLORIDE 0.9 % IV SOLN
INTRAVENOUS | Status: DC | PRN
Start: 2019-12-03 — End: 2019-12-03

## 2019-12-03 MED ORDER — COCONUT OIL OIL: 1.0000 "application " | TOPICAL_OIL | Status: DC | PRN

## 2019-12-03 MED ORDER — LIDOCAINE HCL 1 % IJ SOLN
INTRAMUSCULAR | Status: AC
  Filled 2019-12-03: qty 20

## 2019-12-03 MED ORDER — SODIUM CHLORIDE 0.9 % IR SOLN
Status: DC | PRN
  Administered 2019-12-03: 1

## 2019-12-03 MED ORDER — SCOPOLAMINE 1 MG/3DAYS TD PT72: 1.0000 | MEDICATED_PATCH | Freq: Once | TRANSDERMAL | Status: DC

## 2019-12-03 MED ORDER — FENTANYL CITRATE (PF) 100 MCG/2ML IJ SOLN
INTRAMUSCULAR | Status: DC | PRN
  Administered 2019-12-03: 15 ug via INTRATHECAL

## 2019-12-03 MED ORDER — CEFAZOLIN SODIUM-DEXTROSE 2-4 GM/100ML-% IV SOLN: 2.0000 g | INTRAVENOUS | Status: DC

## 2019-12-03 MED ORDER — NALBUPHINE HCL 10 MG/ML IJ SOLN: 5.0000 mg | Freq: Once | INTRAMUSCULAR | Status: DC | PRN

## 2019-12-03 MED ORDER — ZOLPIDEM TARTRATE 5 MG PO TABS: 5.0000 mg | ORAL_TABLET | Freq: Every evening | ORAL | Status: DC | PRN

## 2019-12-03 MED ORDER — DEXAMETHASONE SODIUM PHOSPHATE 10 MG/ML IJ SOLN
INTRAMUSCULAR | Status: AC
  Filled 2019-12-03: qty 1

## 2019-12-03 MED ORDER — ACETAMINOPHEN 500 MG PO TABS
1000.0000 mg | ORAL_TABLET | Freq: Four times a day (QID) | ORAL | Status: DC
  Administered 2019-12-03 – 2019-12-06 (×11): 1000 mg via ORAL
  Filled 2019-12-03 (×12): qty 2

## 2019-12-03 SURGICAL SUPPLY — 38 items
BENZOIN TINCTURE PRP APPL 2/3 (GAUZE/BANDAGES/DRESSINGS) ×3 IMPLANT
CHLORAPREP W/TINT 26ML (MISCELLANEOUS) ×3 IMPLANT
CLAMP CORD UMBIL (MISCELLANEOUS) IMPLANT
CLOSURE STERI-STRIP 1/2X4 (GAUZE/BANDAGES/DRESSINGS) ×1
CLOSURE WOUND 1/2 X4 (GAUZE/BANDAGES/DRESSINGS)
CLOTH BEACON ORANGE TIMEOUT ST (SAFETY) ×3 IMPLANT
CLSR STERI-STRIP ANTIMIC 1/2X4 (GAUZE/BANDAGES/DRESSINGS) ×2 IMPLANT
DRSG OPSITE POSTOP 4X10 (GAUZE/BANDAGES/DRESSINGS) ×3 IMPLANT
ELECT REM PT RETURN 9FT ADLT (ELECTROSURGICAL) ×3
ELECTRODE REM PT RTRN 9FT ADLT (ELECTROSURGICAL) ×1 IMPLANT
EXTRACTOR VACUUM KIWI (MISCELLANEOUS) IMPLANT
EXTRACTOR VACUUM M CUP 4 TUBE (SUCTIONS) IMPLANT
EXTRACTOR VACUUM M CUP 4' TUBE (SUCTIONS)
GLOVE BIO SURGEON STRL SZ7 (GLOVE) ×3 IMPLANT
GLOVE BIOGEL PI IND STRL 7.0 (GLOVE) ×3 IMPLANT
GLOVE BIOGEL PI INDICATOR 7.0 (GLOVE) ×6
GOWN STRL REUS W/TWL LRG LVL3 (GOWN DISPOSABLE) ×9 IMPLANT
KIT ABG SYR 3ML LUER SLIP (SYRINGE) IMPLANT
NEEDLE HYPO 25X5/8 SAFETYGLIDE (NEEDLE) IMPLANT
NS IRRIG 1000ML POUR BTL (IV SOLUTION) ×3 IMPLANT
PACK C SECTION WH (CUSTOM PROCEDURE TRAY) ×3 IMPLANT
PAD OB MATERNITY 4.3X12.25 (PERSONAL CARE ITEMS) ×3 IMPLANT
RTRCTR C-SECT PINK 25CM LRG (MISCELLANEOUS) IMPLANT
STRIP CLOSURE SKIN 1/2X4 (GAUZE/BANDAGES/DRESSINGS) IMPLANT
SUT MNCRL 0 VIOLET CTX 36 (SUTURE) ×2 IMPLANT
SUT MONOCRYL 0 CTX 36 (SUTURE) ×4
SUT PLAIN 0 NONE (SUTURE) IMPLANT
SUT PLAIN 2 0 (SUTURE)
SUT PLAIN 2 0 XLH (SUTURE) ×3 IMPLANT
SUT PLAIN ABS 2-0 CT1 27XMFL (SUTURE) IMPLANT
SUT VIC AB 0 CT1 27 (SUTURE) ×6
SUT VIC AB 0 CT1 27XBRD ANBCTR (SUTURE) ×3 IMPLANT
SUT VIC AB 2-0 CT1 27 (SUTURE) ×2
SUT VIC AB 2-0 CT1 TAPERPNT 27 (SUTURE) ×1 IMPLANT
SUT VIC AB 4-0 KS 27 (SUTURE) ×3 IMPLANT
TOWEL OR 17X24 6PK STRL BLUE (TOWEL DISPOSABLE) ×3 IMPLANT
TRAY FOLEY W/BAG SLVR 14FR LF (SET/KITS/TRAYS/PACK) IMPLANT
WATER STERILE IRR 1000ML POUR (IV SOLUTION) ×3 IMPLANT

## 2019-12-03 NOTE — Anesthesia Procedure Notes (Signed)
Spinal  Patient location during procedure: OR Staffing Performed: anesthesiologist  Anesthesiologist: Daija Routson E, MD Preanesthetic Checklist Completed: patient identified, IV checked, risks and benefits discussed, surgical consent, monitors and equipment checked, pre-op evaluation and timeout performed Spinal Block Patient position: sitting Prep: DuraPrep and site prepped and draped Patient monitoring: continuous pulse ox, blood pressure and heart rate Approach: midline Location: L3-4 Injection technique: single-shot Needle Needle type: Pencan  Needle gauge: 24 G Needle length: 9 cm Additional Notes Functioning IV was confirmed and monitors were applied. Sterile prep and drape, including hand hygiene and sterile gloves were used. The patient was positioned and the spine was prepped. The skin was anesthetized with lidocaine.  Free flow of clear CSF was obtained prior to injecting local anesthetic into the CSF. The needle was carefully withdrawn. The patient tolerated the procedure well.      

## 2019-12-03 NOTE — Anesthesia Preprocedure Evaluation (Addendum)
Anesthesia Evaluation  Patient identified by MRN, date of birth, ID band Patient awake    Reviewed: Allergy & Precautions, H&P , NPO status , Patient's Chart, lab work & pertinent test results  History of Anesthesia Complications Negative for: history of anesthetic complications  Airway Mallampati: II  TM Distance: >3 FB Neck ROM: full    Dental no notable dental hx.    Pulmonary asthma ,    Pulmonary exam normal        Cardiovascular negative cardio ROS Normal cardiovascular exam Rhythm:regular Rate:Normal     Neuro/Psych negative neurological ROS  negative psych ROS   GI/Hepatic negative GI ROS, Neg liver ROS,   Endo/Other  Morbid obesity  Renal/GU negative Renal ROS  negative genitourinary   Musculoskeletal   Abdominal   Peds  Hematology  (+) Blood dyscrasia, Sickle cell trait ,   Anesthesia Other Findings  Prior C/S x2  Reproductive/Obstetrics (+) Pregnancy                             Anesthesia Physical Anesthesia Plan  ASA: III  Anesthesia Plan: Spinal   Post-op Pain Management:    Induction:   PONV Risk Score and Plan: 3 and Ondansetron and Treatment may vary due to age or medical condition  Airway Management Planned:   Additional Equipment:   Intra-op Plan:   Post-operative Plan:   Informed Consent: I have reviewed the patients History and Physical, chart, labs and discussed the procedure including the risks, benefits and alternatives for the proposed anesthesia with the patient or authorized representative who has indicated his/her understanding and acceptance.       Plan Discussed with:   Anesthesia Plan Comments:        Anesthesia Quick Evaluation

## 2019-12-03 NOTE — Progress Notes (Signed)
Patient states that she was feeling sob . Patients oxygen level was 98% on room air . Patients breathing was nonlabored . Call Delma Post gave an ordered for Albuterol  (Proventil)2.5mg /31ml take 3 ml by nebulizer Q 6 hours for wheezing or sob. Patient had taken Albuterol inhaler from home . Patient states that she is feeling better since the inhaler . Patient did not take breathing treatment at this time

## 2019-12-03 NOTE — Anesthesia Postprocedure Evaluation (Signed)
Anesthesia Post Note  Patient: Melissa Joyce  Procedure(s) Performed: REPEAT CESAREAN SECTION  (N/A )     Patient location during evaluation: PACU Anesthesia Type: Spinal Level of consciousness: oriented and awake and alert Pain management: pain level controlled Vital Signs Assessment: post-procedure vital signs reviewed and stable Respiratory status: spontaneous breathing, respiratory function stable and nonlabored ventilation Cardiovascular status: blood pressure returned to baseline and stable Postop Assessment: no headache, no backache, no apparent nausea or vomiting and spinal receding Anesthetic complications: no   No complications documented.  Last Vitals:  Vitals:   12/03/19 1210 12/03/19 1236  BP:  (!) 131/81  Pulse: 73 82  Resp: 20 20  Temp:  37.2 C  SpO2: 98% 98%    Last Pain:  Vitals:   12/03/19 1236  TempSrc: Oral   Pain Goal:                Epidural/Spinal Function Cutaneous sensation: Tingles (12/03/19 1200), Patient able to flex knees: Yes (12/03/19 1200), Patient able to lift hips off bed: No (12/03/19 1200), Back pain beyond tenderness at insertion site: No (12/03/19 1200), Progressively worsening motor and/or sensory loss: No (12/03/19 1200), Bowel and/or bladder incontinence post epidural: No (12/03/19 1200)  [name]

## 2019-12-03 NOTE — Progress Notes (Signed)
Respiratory therapy came down to give the patient a breathing nebulizer. Patient was breast feeding an states that she would like to take the treatment once she finishes feeding the baby. Patient is breathing unlabored. Denies sob . Once the patient  Melissa Joyce feeding offered to call respiratory to come and do treatment ,patient states that she is feeling  better and that she does not want the breathing treatment at this time. Will continue to monitor the patient .

## 2019-12-03 NOTE — Transfer of Care (Signed)
Immediate Anesthesia Transfer of Care Note  Patient: Melissa Joyce  Procedure(s) Performed: REPEAT CESAREAN SECTION  (N/A )  Patient Location: PACU  Anesthesia Type:Spinal  Level of Consciousness: awake, alert  and oriented  Airway & Oxygen Therapy: Patient Spontanous Breathing  Post-op Assessment: Report given to RN and Post -op Vital signs reviewed and stable  Post vital signs: Reviewed and stable  Last Vitals:  Vitals Value Taken Time  BP    Temp    Pulse    Resp    SpO2      Last Pain:  Vitals:   12/03/19 0748  TempSrc: Oral         Complications: No complications documented.

## 2019-12-03 NOTE — H&P (Addendum)
Melissa Joyce is a 34 y.o. female presenting for for repeat C/section (3rd) at 40 weeks.  34 yo [id number], first pregnancy with current partner. PNCare from 1st trim, dating sono consistent. Prior C/s x 2, GBS(+) urine, Hgb C trait, microcytic anemia. Growth sono for S>D was AGA in 3rd tirm. Posterior placenta. Decision was made to defer TL after counseling.   OB History    Gravida  5   Para  2   Term  1   Preterm      AB  1   Living  2     SAB  0   TAB  1   Ectopic      Multiple      Live Births  2          Past Medical History:  Diagnosis Date   Allergic rhinitis    Anemia    Chorioamnionitis 09/23/2012   Postpartum care following cesarean delivery (09/22/12) 09/23/2012   Pregnant    S/P repeat low transverse C-section 09/23/2012   Sickle cell trait (HCC)    Past Surgical History:  Procedure Laterality Date   CESAREAN SECTION  2005   CESAREAN SECTION N/A 09/22/2012   Procedure: CESAREAN SECTION;  Surgeon: Melissa Kyle, MD;  Location: [facility];  Service: Obstetrics;  Laterality: N/A;   Family History: family history includes Anemia in her mother; Asthma in her brother, daughter, and sister; Cervical cancer in her maternal grandmother and mother; Heart disease in her father; Hypertension in her paternal grandfather and paternal grandmother; Ovarian cancer in an other family member; Skin cancer in her maternal grandmother; Stroke in her paternal grandfather and paternal grandmother. Social History:  reports that she has never smoked. She has never used smokeless tobacco. She reports previous alcohol use. She reports that she does not use drugs.     Maternal Diabetes: No Genetic Screening: Normal QUAD Maternal Ultrasounds/Referrals: Normal Fetal Ultrasounds or other Referrals:  None Maternal Substance Abuse:  No Significant Maternal Medications:  None Significant Maternal Lab Results:  Group B Strep positive. Hgb C trait Other Comments:  None  Review  of Systems History   Blood pressure (!) 135/72, temperature 98.7 F (37.1 C), temperature source Oral, resp. rate 16, height 5\' 1"  (1.549 m), weight (!) 115.2 kg. Exam Physical Exam  Physical exam:  A&O x 3, no acute distress. Pleasant HEENT neg, no thyromegaly Lungs CTA bilat CV RRR, S1S2 normal Abdo soft, non tender, non acute Extr no edema/ tenderness Pelvic Cx closed, long in office exam FHT  130s Toco none reported   Prenatal labs: ABO, Rh: --/--/O POS (07/29 0850) Antibody: NEG (07/29 0850) Rubella:  Immune RPR: Reactive (07/29 0900) ---> RPR was positive at 1:4 with TPA neg on 07/07/19  3rd trim RPR on 09/13/19 was neg.  HBsAg:   Neg HIV:   Neg GBS:   Positive in urine culture 1st trim Glucola nl QUAD neg   Assessment/Plan: 34 yo G5P2022, 40 wks, here for scheduled 3rd C/section. No sterilization planned.  Risks/complications of surgery reviewed incl infection, bleeding, damage to internal organs including bladder, bowels, ureters, blood vessels, other risks from anesthesia, VTE and delayed complications of any surgery, complications in future surgery reviewed. Also discussed neonatal complications incl difficult delivery, laceration, vacuum assistance, TTN etc. Pt understands and agrees, all concerns addressed.     Pt seen and H&P updated.   Melissa Joyce Melissa Joyce 12/03/2019 9.18 AM

## 2019-12-03 NOTE — Addendum Note (Signed)
Addendum  created 12/03/19 1248 by [name], MD   Clinical Note Signed

## 2019-12-03 NOTE — Op Note (Signed)
Cesarean Section Procedure Note   Melissa Joyce  12/03/2019  Indications: Scheduled Proceedure/Maternal Request 39.5 weeks. Declines sterilization.   Pre-operative Diagnosis: Previous Cesarean Section  Post-operative Diagnosis: Same   Surgeon:  Shea Evans, MD  Assistants: Arlan Organ, CNM   Anesthesia: spinal   Procedure Details:  The patient was seen in the Holding Room. The risks, benefits, complications, treatment options, and expected outcomes were discussed with the patient. The patient concurred with the proposed plan, giving informed consent. identified as Melissa Joyce and the procedure verified as C-Section Delivery. A Time Out was held and the above information confirmed. 2 gm Ancef given. After induction of anesthesia, the patient was draped and prepped in the usual sterile manner, foley was draining urine well.  A pfannenstiel incision was made and carried down through the subcutaneous tissue to the fascia. Fascial incision was made and extended transversely. The fascia was separated from the underlying rectus tissue superiorly and inferiorly. The peritoneum was identified and entered. Peritoneal incision was extended longitudinally. Omental adhesions excised. Alexis-O retractor placed. The utero-vesical peritoneal reflection was incised transversely and the bladder flap was bluntly freed from the lower uterine segment. A low transverse uterine incision was made. Delivered from cephalic presentation was a FEMALE infant with vigorous cry. Apgar scores of 7 at one minute and 9 at five minutes. Delayed cord clamping done at 1 minute and baby handed to NICU team in attendance. Cord ph was not sent. Cord blood was obtained for evaluation. The placenta was removed Intact and appeared normal. The uterine outline, tubes and ovaries appeared normal}. The uterine incision was closed with running locked sutures of . A second imbricating layer sutured.   Hemostasis was observed. Alexis  retractor removed. Omental adhesions to peritoneal edge dissected and freed. Peritoneal closure done with 2-0 Vicryl.  The fascia was then reapproximated with running sutures of 0Vicryl. The subcuticular closure was performed using 2-0plain gut. The skin was closed with 4-0Vicryl. Steristrips and honeycomb dressing placed.  Instrument, sponge, and needle counts were correct prior the abdominal closure and were correct at the conclusion of the case.   Findings: Female infant delivered cephalic from [facility] hysterotomy, 2 layer closure, Normal tubes and ovaries. Meconium stained amniotic fluid. Nursery informed that baby felt a bit warm at delivery. Apgars 7 and 9.    Estimated Blood Loss: 479 cc    Total IV Fluids: 2200 ml LR  Urine Output: 150CC OF clear urine  Specimens: cord blood   Complications: no complications  Disposition: PACU - hemodynamically stable.   Maternal Condition: stable   Baby condition / location:  Couplet care / Skin to Skin  Attending Attestation: I performed the procedure.   Signed: Surgeon(s): Shea Evans, MD

## 2019-12-04 ENCOUNTER — Inpatient Hospital Stay (HOSPITAL_COMMUNITY): Payer: Managed Care, Other (non HMO)

## 2019-12-04 LAB — CBC
HCT: 19.9 % — ABNORMAL LOW (ref 36.0–46.0)
HCT: 23.4 % — ABNORMAL LOW (ref 36.0–46.0)
Hemoglobin: 7.2 g/dL — ABNORMAL LOW (ref 12.0–15.0)
Hemoglobin: 8 g/dL — ABNORMAL LOW (ref 12.0–15.0)
MCH: 29.3 pg (ref 26.0–34.0)
MCH: 27.6 pg (ref 26.0–34.0)
MCHC: 36.2 g/dL — ABNORMAL HIGH (ref 30.0–36.0)
MCHC: 34.2 g/dL (ref 30.0–36.0)
MCV: 80.9 fL (ref 80.0–100.0)
MCV: 80.7 fL (ref 80.0–100.0)
Platelets: 159 10*3/uL (ref 150–400)
Platelets: 155 10*3/uL (ref 150–400)
RBC: 2.46 MIL/uL — ABNORMAL LOW (ref 3.87–5.11)
RBC: 2.9 MIL/uL — ABNORMAL LOW (ref 3.87–5.11)
RDW: 14.4 % (ref 11.5–15.5)
RDW: 14.7 % (ref 11.5–15.5)
WBC: 15.1 10*3/uL — ABNORMAL HIGH (ref 4.0–10.5)
WBC: 14.9 10*3/uL — ABNORMAL HIGH (ref 4.0–10.5)
nRBC: 0 % (ref 0.0–0.2)
nRBC: 0 % (ref 0.0–0.2)

## 2019-12-04 LAB — PREPARE RBC (CROSSMATCH)

## 2019-12-04 MED ORDER — DIPHENHYDRAMINE HCL 25 MG PO CAPS
25.0000 mg | ORAL_CAPSULE | Freq: Once | ORAL | Status: AC
  Administered 2019-12-04: 25 mg via ORAL

## 2019-12-04 MED ORDER — SODIUM CHLORIDE 0.9 % IV SOLN
510.0000 mg | Freq: Once | INTRAVENOUS | Status: AC
  Administered 2019-12-04: 510 mg via INTRAVENOUS
  Filled 2019-12-04 (×2): qty 17

## 2019-12-04 MED ORDER — AEROCHAMBER PLUS FLO-VU MEDIUM MISC
1.0000 | Freq: Once | Status: AC
  Administered 2019-12-05: 1
  Filled 2019-12-04: qty 1

## 2019-12-04 MED ORDER — SODIUM CHLORIDE 0.9% IV SOLUTION: Freq: Once | INTRAVENOUS | Status: AC

## 2019-12-04 MED ORDER — OXYCODONE HCL 5 MG PO TABS
10.0000 mg | ORAL_TABLET | Freq: Four times a day (QID) | ORAL | Status: DC | PRN
  Administered 2019-12-04 – 2019-12-07 (×6): 10 mg via ORAL
  Filled 2019-12-04 (×6): qty 2

## 2019-12-04 MED ORDER — FUROSEMIDE 10 MG/ML IJ SOLN
20.0000 mg | Freq: Once | INTRAMUSCULAR | Status: AC
  Administered 2019-12-04: 20 mg via INTRAVENOUS
  Filled 2019-12-04: qty 4

## 2019-12-04 NOTE — Progress Notes (Signed)
Called Dr. Raye Sorrow to inform her that the patient is reporting sob and has some wheezing . Informed Dr. Raye Sorrow that the patient received a breathing treatment and that the wheezing has stopped ,but she is still reporting some sob . Patient is not breathing laborous. Dr. Prudencio Pair states to monitor the patient and recheck vital in 30 minutes and call with vital.

## 2019-12-04 NOTE — Progress Notes (Signed)
Patient states that she is feeling sob . Patients vitals were 128/81 oxygen 100% room air ,86 pulse ,rr 20 temp 97.9. Respiratory was called for a breathing treatment due to wheezing.

## 2019-12-04 NOTE — Progress Notes (Signed)
Subjective: Postpartum Day 1, Scheduled Repeat Cesarean Delivery Patient reports dizziness when stands. CBC could not be drawn and lab told patient they'll return   Objective: BP (!) 120/62 (BP Location: Left Arm)   Pulse 88   Temp 99.2 F (37.3 C) (Oral)   Resp 20   Ht 5\' 1"  (1.549 m)   Wt (!) 115.2 kg   SpO2 100%   Breastfeeding Unknown   BMI 47.99 kg/m   Vital signs in last 24 hours: Temp:  [97.9 F (36.6 C)-99.2 F (37.3 C)] 99.2 F (37.3 C) (08/01 0600) Pulse Rate:  [66-101] 88 (08/01 0600) Resp:  [17-27] 20 (08/01 0600) BP: (106-131)/(62-100) 120/62 (08/01 0600) SpO2:  [98 %-100 %] 100 % (08/01 0600)  Physical Exam:  General: alert and cooperative but appears pale CV RRR, no tachycardia Resp- CTA bilat  Abdo distended, gaseous, non surgical/ no rebound/ guarding.  Uterine Fundus: firm at U Incision: drainage noted, RN to change dressing  DVT Evaluation: No evidence of DVT seen on physical exam. Negative Homan's sign.  CBC Latest Ref Rng & Units 12/04/2019 12/01/2019   WBC 4.0 - 10.5 K/uL 15.1(H) 12.0(H)   Hemoglobin 12.0 - 15.0 g/dL 7.2(L) 13.4   Hematocrit 36 - 46 % 19.9(L) 39.4   Platelets 150 - 400 K/uL 159 233     RPR 1:1 here at admission --> RPR pos 1:4, TPA neg- 07/07/19, RPR neg 09/13/19   Assessment/Plan: Status post Repeat Cesarean section. POD #1.  1) Acute symptomatic anemia- CBC just came back, Unexplained why this low with intra-op EBL about 500 cc. Exam- non surgical abdomen but maternal BMI can mask intra-abdominal process. Plan IV Iron and 1 unit pRBCs since symptomatic and repeat CBC at 4 hrs. If not stable, will proceed with abdominal CT. Hold post-op Lovenox prophylaxis  2) Post -op abdomina pain - in upper abdomen rather than incision, changed to Oxycodone 10 mg, gas management, ambulate once dizziness better. CT if not better  Routine PP care and breast feeding assistance RN advised to not allow pt to ambulate without assistance   11/13/19 12/04/2019, 9:49 AM

## 2019-12-04 NOTE — Progress Notes (Addendum)
Patient called out stating that she is still feeling sob. Called Dr. Raye Sorrow @ 2122 to Dr. Raye Sorrow states that she  Is coming to see the patient and to tell the patient to take 2 puffs of her inhaler. BP was 148/59,pulse 88,oxyen 99 on room air rr 22 will continue to monitor

## 2019-12-04 NOTE — Progress Notes (Signed)
Called to see Melissa Joyce for shortness of breath  History of present illness: 34 year old postop day 1 from scheduled repeat cesarean section at term notes sudden onset of shortness of breath at 6 PM tonight.  Melissa Joyce gives a history of new diagnosis of asthma in her seventh month of pregnancy.  Melissa Joyce did not have asthma and either of her other pregnancies.  Melissa Joyce did have outpatient consult with pulmonology who recommended Ventolin nebulizer as needed at night.  Melissa Joyce states she has done nightly Ventolin for most of the past 2 months.  She did have a short time after a 5-day oral course of steroids that she used less of her nebulizer.  Melissa Joyce does report being prescribed a Flovent inhaler but she did not pick it up at the pharmacy.  Melissa Joyce also uses a Ventolin inhaler, without a spacer, and prefers to use 4 puffs over 2.  Melissa Joyce had acute symptomatic anemia today.  A.m. hemoglobin was 7.2, unexplained by the surgical blood loss.  Melissa Joyce reports minimal vaginal bleeding.  Melissa Joyce did have 1 unit of packed red cell transfusion today.  It does not appear that she got any Lasix with her blood.  Melissa Joyce has not been on any Lovenox.  Melissa Joyce does note being up 3 times today and has been wearing her SCDs while in bed.  Melissa Joyce's posttransfusion hemoglobin was 8.0  Melissa Joyce does note abdominal pain but did get some relief with narcotic meds several hours ago.  Melissa Joyce reports several voided since her Foley was out.  Melissa Joyce denies chest pain.  Melissa Joyce has been passing flatus.  She has intermittent nausea but was able to tolerate a regular dinner.  No bowel movement yet.  Melissa Joyce notes minimal vaginal bleeding.  Melissa Joyce has been using a heating pad on her abdomen.  Melissa Joyce notes no lower extremity pain.  Melissa Joyce feels her thighs are "tight" but no swelling below the knees.  Melissa Joyce denies current or history of anxiety  When her shortness of breath developed Melissa Joyce had a nebulizer treatment with respiratory.   Melissa Joyce states this did not help as much as her usual nebulizer treatment though reportedly the dose and medications are the same.  Given the lack of effect of the nebulizer Melissa Joyce then took 4 puffs of her home Ventolin inhaler and notes some improvement.  She still remains short of breath.  O: Vitals:   12/04/19 2055 12/04/19 2122 12/04/19 2135 12/04/19 2145  BP:  (!) 148/59  (!) 121/59  Pulse: 98 88 87 96  Resp: (!) 24 22 22 22   Temp:      TempSrc:      SpO2:  99% 99% 99%  Weight:      Height:       General: Well-appearing, no distress Cardiovascular: Regular rate and rhythm, no murmur Pulmonary: Expiratory wheezes when laying back but only intermittent and faint wheeze in the right lung when sitting up.  Of note Melissa Joyce does not feel short of breath when sitting up.  Melissa Joyce is not using accessory breathing muscles. Abdomen: Soft distention, tympany in upper abdomen, moderate tenderness throughout Incision: Dressed, clean GU: Minimal staining on the pad Lower extremity: Nontender trace edema, no warmth, no cords behind the knee  Assessment and plan: 34 year old obese G3, P3 postop day 1 from repeat cesarean section with significant anemia, history of asthma and now new shortness of breath.  Adequate response to 1 unit packed red blood cells.  Still unclear etiology of her anemia.  Operative blood loss and postop  vaginal bleeding do not match with her hemoglobin results.  Concern for possible abdominal wall hematoma which could certainly lead to feelings of shortness of breath as Melissa Joyce is unable to take deep breaths due to pain.  No evidence of continued bleeding.  Tympanic abdomen and awaiting return of bowel function.  History of asthma and current wheezes may be due to asthma though they have not responded to her standard treatment.  Given her recent blood transfusion and her wheezes which are worse in a reclined first upright position concern for pulmonary edema.  Chest x-ray ordered  now.  20 mg IV Lasix given x1.  PE certainly remains in the differential given a obese pregnant Melissa Joyce with recent C-section who has not been on from pharmaco- thromboprophylaxis. (appropriately held due to concerns for bleeding) the Melissa Joyce remains with normal oxygen saturation and does not show tachycardia.  Will reevaluate after the Lasix and once the chest x-ray results.  A.m. labs ordered.

## 2019-12-04 NOTE — Lactation Note (Signed)
This note was copied from a baby's chart. Lactation Consultation Note  Patient Name: Melissa Joyce Date: 12/04/2019 Reason for consult: Follow-up assessment  P3 mother whose infant is now 45 hours old.  This is a term baby at 39+5 weeks.  Mother breast fed her first child for 3 months and her second child for 9 months.  Baby was asleep on mother's chest when I arrived.  She had no questions/concerns related to breast feeding.  She is familiar with feeding cues and hand expression.  Mother seems very confident in her ability to breast feed.  She will continue feeding on cue or at least 8-12 times/24 hours.  Mother will call for latch assistance as needed.  Mother has a DEBP for home use.  RN in room at the end of my visit to provide pain medication for mother.  No support person present at this time.   Maternal Data    Feeding Feeding Type: Breast Fed  LATCH Score                   Interventions    Lactation Tools Discussed/Used     Consult Status Consult Status: Follow-up Date: 12/05/19 Follow-up type: In-patient    Dora Sims 12/04/2019, 5:43 PM

## 2019-12-04 NOTE — Progress Notes (Addendum)
Call Dr. Raye Sorrow  To give results of cbc.Informed her that the patients hemoglobin was 8 and platelets were 158. Dr. Raye Sorrow states to call if patients systolic blood pressure is less than 100  And if diastolic blood pressure less than 55,or if the patients pulse 110 Tachycardia Will continue to monitor

## 2019-12-05 DIAGNOSIS — O9902 Anemia complicating childbirth: Secondary | ICD-10-CM

## 2019-12-05 LAB — TYPE AND SCREEN
ABO/RH(D): O POS
Antibody Screen: NEGATIVE
Unit division: 0

## 2019-12-05 LAB — COMPREHENSIVE METABOLIC PANEL
ALT: 26 U/L (ref 0–44)
AST: 30 U/L (ref 15–41)
Albumin: 2.2 g/dL — ABNORMAL LOW (ref 3.5–5.0)
Alkaline Phosphatase: 73 U/L (ref 38–126)
Anion gap: 7 (ref 5–15)
BUN: 6 mg/dL (ref 6–20)
CO2: 22 mmol/L (ref 22–32)
Calcium: 8.5 mg/dL — ABNORMAL LOW (ref 8.9–10.3)
Chloride: 105 mmol/L (ref 98–111)
Creatinine, Ser: 0.73 mg/dL (ref 0.44–1.00)
GFR calc Af Amer: >60 mL/min (ref >60)
GFR calc non Af Amer: >60 mL/min (ref >60)
Glucose, Bld: 92 mg/dL (ref 70–99)
Potassium: 4.2 mmol/L (ref 3.5–5.1)
Sodium: 134 mmol/L — ABNORMAL LOW (ref 135–145)
Total Bilirubin: 0.9 mg/dL (ref 0.3–1.2)
Total Protein: 5.4 g/dL — ABNORMAL LOW (ref 6.5–8.1)

## 2019-12-05 LAB — CBC WITH DIFFERENTIAL/PLATELET
Abs Immature Granulocytes: 0.21 10*3/uL — ABNORMAL HIGH (ref 0.00–0.07)
Basophils Absolute: 0 10*3/uL (ref 0.0–0.1)
Basophils Relative: 0 %
Eosinophils Absolute: 0 10*3/uL (ref 0.0–0.5)
Eosinophils Relative: 0 %
HCT: 20.2 % — ABNORMAL LOW (ref 36.0–46.0)
Hemoglobin: 7.1 g/dL — ABNORMAL LOW (ref 12.0–15.0)
Immature Granulocytes: 2 %
Lymphocytes Relative: 12 %
Lymphs Abs: 1.4 10*3/uL (ref 0.7–4.0)
MCH: 28.4 pg (ref 26.0–34.0)
MCHC: 35.1 g/dL (ref 30.0–36.0)
MCV: 80.8 fL (ref 80.0–100.0)
Monocytes Absolute: 0.9 10*3/uL (ref 0.1–1.0)
Monocytes Relative: 8 %
Neutro Abs: 9.7 10*3/uL — ABNORMAL HIGH (ref 1.7–7.7)
Neutrophils Relative %: 78 %
Platelets: 153 10*3/uL (ref 150–400)
RBC: 2.5 MIL/uL — ABNORMAL LOW (ref 3.87–5.11)
RDW: 14.6 % (ref 11.5–15.5)
WBC: 12.4 10*3/uL — ABNORMAL HIGH (ref 4.0–10.5)
nRBC: 0.2 % (ref 0.0–0.2)

## 2019-12-05 LAB — BPAM RBC
Blood Product Expiration Date: 202108312359
ISSUE DATE / TIME: 202108011149
Unit Type and Rh: 5100

## 2019-12-05 LAB — T.PALLIDUM AB, TOTAL: T Pallidum Abs: NONREACTIVE

## 2019-12-05 LAB — PREPARE RBC (CROSSMATCH)

## 2019-12-05 MED ORDER — SODIUM CHLORIDE 0.9% IV SOLUTION: Freq: Once | INTRAVENOUS | Status: DC

## 2019-12-05 MED ORDER — FUROSEMIDE 10 MG/ML IJ SOLN
20.0000 mg | Freq: Once | INTRAMUSCULAR | Status: AC
  Administered 2019-12-05: 20 mg via INTRAVENOUS
  Filled 2019-12-05: qty 4

## 2019-12-05 MED ORDER — FLUTICASONE PROPIONATE HFA 110 MCG/ACT IN AERO
2.0000 | INHALATION_SPRAY | Freq: Two times a day (BID) | RESPIRATORY_TRACT | Status: DC
  Administered 2019-12-05 – 2019-12-07 (×5): 2 via RESPIRATORY_TRACT
  Filled 2019-12-05: qty 12

## 2019-12-05 NOTE — Progress Notes (Addendum)
Subjective: POD# 2 Live born female  Birth Weight: 7 lb 6.5 oz (3360 g) APGAR: 7, 9  Newborn Delivery   Birth date/time: 12/03/2018 10:11:00 Delivery type: C-Section, Low Transverse Trial of labor: No C-section categorization: Repeat     Baby name: Melissa Joyce "KJ" Delivering provider: MODY, VAISHALI  Circumcision: complete Feeding: breast and bottle  Pain control at delivery: Spinal   Reports feeling dizzy and lightheaded, much more with movement or ambulating. States she has significant abdominal pain that she states is "gas". Unable to ambulate without feeling dizzy. States she has some incisional pain but that the gas pain is much worse.   Patient reports tolerating PO.   Breast symptoms: None Pain medication narcotic analgesics including Oxy IR, not well controlled Denies HA or chest pain. + dizziness + SOB. Passing flatus occasionally  She reports vaginal bleeding as normal, without clots.  She is ambulating with assistance to bathroom, urinating without difficulty.     Objective:  Vitals:   12/04/19 2259 12/04/19 2357 12/05/19 0117 12/05/19 0542  BP:  118/65 (!) 130/79 (!) 144/72  Pulse: 98 98 98 98  Resp: 20 20 20 20   Temp:  97.8 F (36.6 C) 98.7 F (37.1 C) 98.1 F (36.7 C)  TempSrc:  Oral Oral Oral  SpO2: 100% 100% 100% 100%  Weight:      Height:         Intake/Output Summary (Last 24 hours) at 12/05/2019 1140 Last data filed at 12/04/2019 1150 Gross per 24 hour  Intake --  Output 450 ml  Net -450 ml      Recent Labs    12/04/19 1920 12/05/19 0922  WBC 14.9* 12.4*  HGB 8.0* 7.1*  HCT 23.4* 20.2*  PLT 155 153    Blood type: --/--/O POS (07/29 0850)  Rubella: Immune (03/04 0000)  Vaccines: TDaP UTD         Flu    Declined   Physical Exam:  General: alert, cooperative, fatigued, mild distress and morbidly obese CV: Regular rate and rhythm Resp: clear throughout Abdomen: tympanic throughout, + bowel sounds throughout, non-tender, not rigid Incision:  clean, dry and intact Uterine Fundus: firm, below umbilicus, nontender Lochia: minimal Ext: extremities normal, atraumatic, no cyanosis or edema and Homans sign is negative, no sign of DVT  Assessment/Plan: 34 y.o.   POD# 2. 34                  Principal Problem:   Postpartum care following cesarean delivery 7/31 Active Problems:   Status post repeat low transverse cesarean section   Biological false positive RPR test   Maternal anemia, with delivery  Anemia    - H&H this AM dropped from 8.0/23.4 post 1 unit of PRBCs yesterday to 7.1/20.2    - Pt with SOB and dizziness at rest, increased with movement or ambulation    - Vaginal bleeding is minimal, bowel sounds throughout, tympanic sounds with percussion    - Consult with Dr. 08-28-1982 regarding labs and symptoms    - Transfuse 2 units of PRBCs now with 20mg  of IV Lasix between units    - Will obtain CBC after transfusion  Shortness of breath    - History of asthma with this pregnancy only    - Ventolin nebulizer use at night as needed    - CXR today shows low lung volumes with bronchovascular crowding versus vascular congestion    - O2 saturation 100% on room air    - Encourage  use of incentive spirometer    - SOB likely related to anemia, will continue to monitor   Status post repeat cesarean section            - Continue regular diet    - Encourage warm liquids for gut motility    - Once able, encourage ambulation with assistance for relief of gas pain    - Continue Oxy IR for pain    - Encourage rest when baby rests    - Breastfeeding support as needed  Dr. Ernestina Penna on her way to hospital for assessment of patient.    June Leap, CNM, MSN 12/05/2019, 11:40 AM  Patient also seen by me  Patient notes still somewhat short of breath but feels much better today after getting some sleep last night.  Patient states no current wheezing.  Patient states apple and prune juice have been helping her with flatus.  She is up  minimally.  She still notes significant abdominal pain far worse than her prior C-section recoveries.  Pain medications and heating pad helped.  She is breast-feeding well with good latch but does note that baby has dropped in weight.  Objective: Vitals:   12/04/19 2259 12/04/19 2357 12/05/19 0117 12/05/19 0542  BP:  118/65 (!) 130/79 (!) 144/72  Pulse: 98 98 98 98  Resp: 20 20 20 20   Temp:  97.8 F (36.6 C) 98.7 F (37.1 C) 98.1 F (36.7 C)  TempSrc:  Oral Oral Oral  SpO2: 100% 100% 100% 100%  Weight:      Height:       General: No distress Cardiovascular: Tachycardic, regular rhythm, no murmur Pulmonary: Clear to auscultation bilaterally but shallow breaths, no crackles Abdomen: Soft distention, improved from yesterday.  Tympany present, moderate tenderness Incision: Dressed, clean GU: Scant staining Lower extremity: Trace edema, nontender, no warmth, no cords  Assessment plan: Postop day 2 with significant anemia and now shortness of breath in the setting of asthma and probable abdominal wall hematoma  Anemia.  No evidence of continued active bleeding.  No vaginal bleeding.  Will give additional 2 units packed red cells now and follow posttransfusion CBC.  Overall vitals stable.  Suspect abdominal wall hematoma.  Can consider CT scan for definitive diagnosis but at this time it would not change management.  If patient does not have appropriate rise in hemoglobin after 2 units today, consider CT scan to assess for other etiologies of the anemia. -Shortness of breath.  Likely combination of poorly controlled asthma, possible fluid overload from blood transfusion and third spacing due to anemia and symptomatic anemia.  Patient also with shallow respiratory effort likely due to pain.  Encourage incentive spirometer, await return of bowel function.  Will transfuse 2 units packed red cells now.  Give Lasix between.  Continue on Ventolin nebulizers but will add Flovent inhaler for better  long-term control.  PE also in the differential diagnosis but given 100% O2 sats and no evidence of DVT will defer CT at this time.  Continue to hold anticoagulation given anemia and concerns for possible bleeding.  12/05/2019 12:40 PM

## 2019-12-05 NOTE — Lactation Note (Signed)
This note was copied from a baby's chart. Lactation Consultation Note  Patient Name: Boy Syvilla Martin LPFXT'[id number] Date: 12/05/2019  Mom is a P3 with post partum complications.  Mom getting blood transfusion on arrival.  Infant sucking on pacifier with slightly dry lips. Mom reports recently fed him at 4 something  Or a little later. Mom reports he has been breastfeeding well.   Discussed weight loss with mom.  Explained I knew she was mom but that we were really concerned about his weight loss.  Explained that it would be great if we could give him something after breastfeeds.  Asked mom if she would agree to pump or hand express past breastfeedings.  Mom agreed and preferred to use her breastpump. Assisted mom in pumping with her breastpump.  Mom using Lansinoh DEBP.  Mom pumped for 15 minutes and obtained 20 ml.  LC fed him 10 ml.  Handed him to mom she fed another 10 m via spoon.  Infant still crying and cuing.  LC had dropped pacifier on the floor. Washed moms pump parts and pacifier but encouraged mom not to give pacifier.  Urged mom to continue to feed on cue and pump and hand express past the every 2-3 hour or so breastfeeding. Mom latched him while LC washed parts.  Infant falling asleep. Mom reports he falls asleep while breastfeeding a lot.   Asked mom if we could reposition him slightly to help him get more breast in his mouth.  She reported she had tried other things and this was the only position he sucked in. LC attempt to do a chin tug and tuck him in some. He would do a few sucks then and stop.  Spoke with RN about positioning.  Mom also has him swaddled.  Urged her to unswaddle him and make him a little more uncomfortable. Lactation to follow up with mom. Urged her to call as needed.     Maternal Data    Feeding Feeding Type: Breast Fed  [facility] Scs Score                   Interventions    Lactation Tools Discussed/Used     Consult Status      Gayla Benn Michaelle Copas 12/05/2019, 6:52  PM

## 2019-12-05 NOTE — Progress Notes (Signed)
Patient denies any shortness of breath,dizziness. Patient is not currently wheezing

## 2019-12-06 LAB — TYPE AND SCREEN
ABO/RH(D): O POS
Antibody Screen: NEGATIVE
Unit division: 0
Unit division: 0

## 2019-12-06 LAB — CBC
HCT: 26.1 % — ABNORMAL LOW (ref 36.0–46.0)
Hemoglobin: 9 g/dL — ABNORMAL LOW (ref 12.0–15.0)
MCH: 28.7 pg (ref 26.0–34.0)
MCHC: 34.5 g/dL (ref 30.0–36.0)
MCV: 83.1 fL (ref 80.0–100.0)
Platelets: 161 10*3/uL (ref 150–400)
RBC: 3.14 MIL/uL — ABNORMAL LOW (ref 3.87–5.11)
RDW: 15.1 % (ref 11.5–15.5)
WBC: 9.7 10*3/uL (ref 4.0–10.5)
nRBC: 0.9 % — ABNORMAL HIGH (ref 0.0–0.2)

## 2019-12-06 LAB — CBC WITH DIFFERENTIAL/PLATELET
Abs Immature Granulocytes: 0.22 10*3/uL — ABNORMAL HIGH (ref 0.00–0.07)
Basophils Absolute: 0 10*3/uL (ref 0.0–0.1)
Basophils Relative: 0 %
Eosinophils Absolute: 0 10*3/uL (ref 0.0–0.5)
Eosinophils Relative: 0 %
HCT: 24.8 % — ABNORMAL LOW (ref 36.0–46.0)
Hemoglobin: 8.7 g/dL — ABNORMAL LOW (ref 12.0–15.0)
Immature Granulocytes: 2 %
Lymphocytes Relative: 14 %
Lymphs Abs: 1.5 10*3/uL (ref 0.7–4.0)
MCH: 28.9 pg (ref 26.0–34.0)
MCHC: 35.1 g/dL (ref 30.0–36.0)
MCV: 82.4 fL (ref 80.0–100.0)
Monocytes Absolute: 0.9 10*3/uL (ref 0.1–1.0)
Monocytes Relative: 8 %
Neutro Abs: 8.1 10*3/uL — ABNORMAL HIGH (ref 1.7–7.7)
Neutrophils Relative %: 76 %
Platelets: 156 10*3/uL (ref 150–400)
RBC: 3.01 MIL/uL — ABNORMAL LOW (ref 3.87–5.11)
RDW: 14.9 % (ref 11.5–15.5)
WBC: 10.8 10*3/uL — ABNORMAL HIGH (ref 4.0–10.5)
nRBC: 0.4 % — ABNORMAL HIGH (ref 0.0–0.2)

## 2019-12-06 LAB — BPAM RBC
Blood Product Expiration Date: 202108312359
Blood Product Expiration Date: 202108312359
ISSUE DATE / TIME: 202108021604
ISSUE DATE / TIME: 202108021858
Unit Type and Rh: 5100
Unit Type and Rh: 5100

## 2019-12-06 LAB — SURGICAL PATHOLOGY

## 2019-12-06 MED ORDER — POLYSACCHARIDE IRON COMPLEX 150 MG PO CAPS
150.0000 mg | ORAL_CAPSULE | Freq: Every day | ORAL | Status: DC
  Administered 2019-12-06 – 2019-12-07 (×2): 150 mg via ORAL
  Filled 2019-12-06 (×2): qty 1

## 2019-12-06 MED ORDER — ACETAMINOPHEN 500 MG PO TABS
1000.0000 mg | ORAL_TABLET | Freq: Four times a day (QID) | ORAL | Status: DC
  Administered 2019-12-06 – 2019-12-07 (×3): 1000 mg via ORAL
  Filled 2019-12-06 (×3): qty 2

## 2019-12-06 MED ORDER — MAGNESIUM OXIDE 400 (241.3 MG) MG PO TABS
400.0000 mg | ORAL_TABLET | Freq: Every day | ORAL | Status: DC
  Administered 2019-12-06 – 2019-12-07 (×2): 400 mg via ORAL
  Filled 2019-12-06 (×2): qty 1

## 2019-12-06 MED ORDER — IBUPROFEN 600 MG PO TABS
600.0000 mg | ORAL_TABLET | Freq: Four times a day (QID) | ORAL | Status: DC
  Administered 2019-12-06 – 2019-12-07 (×4): 600 mg via ORAL
  Filled 2019-12-06 (×5): qty 1

## 2019-12-06 NOTE — Progress Notes (Addendum)
Subjective: POD# 3 Live born female  Birth Weight: 7 lb 6.5 oz (3360 g) APGAR: 7, 9  Newborn Delivery   Birth date/time: 12/03/2019 10:11:00 Delivery type: C-Section, Low Transverse Trial of labor: No C-section categorization: Repeat     Baby name: Melissa Joyce. Delivering provider: Damein Gaunce   circumcision completed Feeding: breast  Pain control at delivery: Spinal   Reports feeling much better today, SOB improved, pain well controlled w/ APAP, NSAID and oxi.  Has been OOB in room, BM yesterday, passing gas.  Voiding w/o difficulty.   Objective:   VS:    Vitals:   12/05/19 1902 12/05/19 1927 12/06/19 0530 12/06/19 0534  BP: 130/70 113/60 110/60 110/60  Pulse: (!) 101 (!) 101 88 80  Resp: 20 20 16    Temp: 98.8 F (37.1 C) 98.7 F (37.1 C) 97.9 F (36.6 C)   TempSrc: Oral Oral Oral   SpO2: 100% 100% 99%   Weight:      Height:          Intake/Output Summary (Last 24 hours) at 12/06/2019 1153 Last data filed at 12/05/2019 2113 Gross per 24 hour  Intake 843 ml  Output --  Net 843 ml        Recent Labs    12/06/19 0116 12/06/19 1108  WBC 10.8* 9.7  HGB 8.7* 9.0*  HCT 24.8* 26.1*  PLT 156 161     Blood type: --/--/O POS (08/02 1256)  Rubella: Immune (03/04 0000)    Physical Exam:  General: alert, cooperative and no distress CV: Regular rate and rhythm Resp: clear Abdomen: soft, nontender, normal bowel sounds Incision: clean, dry and intact Uterine Fundus: firm, below umbilicus, nontender Lochia: minimal Ext: no edema, redness or tenderness in the calves or thighs      Assessment/Plan: 34 y.o.   POD# 3. 34                  Principal Problem:   Postpartum care following cesarean delivery 7/31 Active Problems:   Status post repeat low transverse cesarean section   Biological false positive RPR test   Maternal anemia, with delivery   Anemia    - H&H this AM stable at 9.0/26.1    - S/P PRBC 2 units transfused    - clinically  improved, no further tachycardia    - started oral Fe and Mag ox  Shortness of breath    - History of asthma with this pregnancy only    - Ventolin nebulizer use at night as needed    - CXR yesterday showd low lung volumes with bronchovascular crowding versus vascular congestion    - O2 saturation 100% on room air    - Encourage use of incentive spirometer    - Flovent added for maintenance    Status post repeat cesarean section            - Continue regular diet    - Encourage warm liquids for gut motility    - Encourage ambulation with assistance for relief of gas pain    - Continue Oxy IR for pain    - Encourage rest when baby rests    - Breastfeeding support as needed  Continue inpatient, POC in consult w/ Dr. 06-08-1976, CNM, MSN 12/06/2019, 11:53 AM  MD note-  Pt seen and examined. Agree with above note and plan. Labs and imaging reviewed. Pt appears well and reports feeling well with not much pain abdominal distension reduced  and passed flatus, eating and dizziness has resolved entirely, SOB also resolved.Marland Kitchen Extended stay in hospital due to Acute Blood loss anemia needing close monitoring,  likely a subfascial hematoma due to dissection needed b/w fascia and muscles. S/p 3 units pRBCs and 1 dose of Feraheme with improved status. Anticipate discharge tomorrow. Baby is also doing better with feeds since yesterday. No depression/amxiety. Good help at home. --V.Maeci Kalbfleisch MD

## 2019-12-06 NOTE — Lactation Note (Signed)
This note was copied from a baby's chart. Lactation Consultation Note  Patient Name: Melissa Joyce [id number]'[id number] Date: 12/06/2019   Infant is 51 hrs old. When I entered room, Mom was nursing infant. As I approached, I could immediately hear frequent, spontaneous swallows. Mom has no breast complaints. She reported that infant takes both breasts at the feedings & one breast will leak while feeding from the other.  I anticipate that infant will show continued weight gain.  Lurline Hare [facility] 12/06/2019, 3:04 PM

## 2019-12-06 NOTE — Progress Notes (Addendum)
Mom is up and walking in the halls with baby in the crib.

## 2019-12-07 MED ORDER — MAGNESIUM OXIDE -MG SUPPLEMENT 400 (240 MG) MG PO TABS
400.0000 mg | ORAL_TABLET | Freq: Every day | ORAL | Status: DC
Start: 2019-12-07 — End: 2021-04-02

## 2019-12-07 MED ORDER — SENNOSIDES-DOCUSATE SODIUM 8.6-50 MG PO TABS: 2.0000 | ORAL_TABLET | ORAL | Status: DC

## 2019-12-07 MED ORDER — ACETAMINOPHEN 500 MG PO TABS: 1000.0000 mg | ORAL_TABLET | Freq: Four times a day (QID) | ORAL | 0 refills | Status: DC

## 2019-12-07 MED ORDER — FLUTICASONE PROPIONATE HFA 110 MCG/ACT IN AERO: 2.0000 | INHALATION_SPRAY | Freq: Two times a day (BID) | RESPIRATORY_TRACT | 12 refills | Status: DC

## 2019-12-07 MED ORDER — POLYSACCHARIDE IRON COMPLEX 150 MG PO CAPS: 150.0000 mg | ORAL_CAPSULE | Freq: Every day | ORAL | Status: DC

## 2019-12-07 MED ORDER — OXYCODONE HCL 10 MG PO TABS: 10.0000 mg | ORAL_TABLET | Freq: Four times a day (QID) | ORAL | 0 refills | Status: DC | PRN

## 2019-12-07 MED ORDER — SIMETHICONE 80 MG PO CHEW: 80.0000 mg | CHEWABLE_TABLET | ORAL | 0 refills | Status: DC | PRN

## 2019-12-07 MED ORDER — COCONUT OIL OIL: 1.0000 "application " | TOPICAL_OIL | 0 refills | Status: DC | PRN

## 2019-12-07 NOTE — Progress Notes (Signed)
AVS printed and discharged instructions given to pt. Pt instructed to call for follow-up appt and to pick up prescriptions. All questions answered and pt verbalized understanding. Pt discharged home with FOB in the room.

## 2019-12-07 NOTE — Discharge Summary (Signed)
OB Discharge Summary  Patient Name: Melissa Joyce DOB: 1985-11-01 MRN: [id number]  Date of admission: 12/03/2019 Delivering provider: MODY, VAISHALI   Admitting diagnosis: Previous cesarean section [Z98.891] Status post repeat low transverse cesarean section [Z98.891] Intrauterine pregnancy: [redacted]w[redacted]d     Secondary diagnosis: Patient Active Problem List   Diagnosis Date Noted   Maternal anemia, with delivery 12/05/2019   Status post repeat low transverse cesarean section 12/03/2019   Biological false positive RPR test 12/03/2019   Asthma, moderate persistent 10/21/2019   Postpartum care following cesarean delivery 7/31 09/23/2012   Additional problems: none   Date of discharge: 12/07/2019   Discharge diagnosis: Principal Problem:   Postpartum care following cesarean delivery 7/31 Active Problems:   Status post repeat low transverse cesarean section   Biological false positive RPR test   Maternal anemia, with delivery                                                              Post partum procedures:blood transfusion and IV iron transfusion  Augmentation: N/A Pain control: Spinal  Laceration:None  Episiotomy:None  Complications: None  Hospital course:  Sceduled C/S   34 y.o. yo G5P2013 at [redacted]w[redacted]d was admitted to the hospital 12/03/2019 for scheduled cesarean section with the following indication:Elective Repeat.Delivery details are as follows:  Membrane Rupture Time/Date: 10:11 AM ,12/03/2019   Delivery Method:C-Section, Low Transverse  Details of operation can be found in separate operative note.  Patient had a postpartum course postpartum course complicated by significant drop in hemoglobin not reflective of quantified blood loss in surgery, she developed tachycardia and shortness of breath on post-op day 2. Concern for possible abdominal wall hematoma. Chest x-ray did not show evidence of pulmonary embolus or pneumonia. She was given 3 units of PRBC's, started on Flovent along  with ventolin nebulizing treatments. On day 3 her hemoglobin improved to 9.0 and remained stable throughout her hospital course. On post-op day 3 she started feeling better, SOB resolving and pain well managed with oral medications.  She is ambulating, tolerating a regular diet, passing flatus, and urinating well. Patient is discharged home in stable condition on  12/07/19        Newborn Data: Birth date:12/03/2019  Birth time:10:11 AM  Gender:Female  Living status:Living  Apgars:7 ,9  Weight:3360 g     Physical exam  Vitals:   12/06/19 0534 12/06/19 1438 12/06/19 2221 12/07/19 0523  BP: 110/60 118/70 126/74 129/84  Pulse: 80 95 90 78  Resp:  18 19 18   Temp:  98.6 F (37 C) 98.3 F (36.8 C) 98.2 F (36.8 C)  TempSrc:  Oral Oral Oral  SpO2:   99% 99%  Weight:      Height:       General: alert, cooperative and no distress Lochia: appropriate Uterine Fundus: firm Incision: Healing well with no significant drainage DVT Evaluation: No cords or calf tenderness. Calf/Ankle edema is present Labs: Lab Results  Component Value Date   WBC 9.7 12/06/2019   HGB 9.0 (L) 12/06/2019   HCT 26.1 (L) 12/06/2019   MCV 83.1 12/06/2019   PLT 161 12/06/2019   CMP Latest Ref Rng & Units 12/05/2019  Glucose 70 - 99 mg/dL 92  BUN 6 - 20 mg/dL 6  Creatinine 02/04/2020 - 3.00 mg/dL 9.23  Sodium 135 - 145 mmol/L 134(L)  Potassium 3.5 - 5.1 mmol/L 4.2  Chloride 98 - 111 mmol/L 105  CO2 22 - 32 mmol/L 22  Calcium 8.9 - 10.3 mg/dL 5.9(R)  Total Protein 6.5 - 8.1 g/dL 4.1(U)  Total Bilirubin 0.3 - 1.2 mg/dL 0.9  Alkaline Phos 38 - 126 U/L 73  AST 15 - 41 U/L 30  ALT 0 - 44 U/L 26   Edinburgh Postnatal Depression Scale Screening Tool 12/04/2019 12/03/2019  I have been able to laugh and see the funny side of things. 0 (No Data)  I have looked forward with enjoyment to things. 0 -  I have blamed myself unnecessarily when things went wrong. 1 -  I have been anxious or worried for no good reason. 0 -  I have  felt scared or panicky for no good reason. 0 -  Things have been getting on top of me. 0 -  I have been so unhappy that I have had difficulty sleeping. 0 -  I have felt sad or miserable. 0 -  I have been so unhappy that I have been crying. 0 -  The thought of harming myself has occurred to me. 0 -  Edinburgh Postnatal Depression Scale Total 1 -    Discharge instruction:  per After Visit Summary,  [facility] booklet and  "Understanding Mother & Baby Care" hospital booklet  After Visit Meds:  Allergies as of 12/07/2019      Reactions   Bee Venom Swelling   Shrimp [shellfish Allergy] Hives   States only "steamed shrimp". Other ways of cooking shrimp are fine.       Medication List    STOP taking these medications   Pulmicort Flexhaler 90 MCG/ACT inhaler Generic drug: Budesonide     TAKE these medications   acetaminophen 500 MG tablet Commonly known as: TYLENOL Take 2 tablets (1,000 mg total) by mouth every 6 (six) hours.   albuterol (2.5 MG/3ML) 0.083% nebulizer solution Commonly known as: PROVENTIL Take 3 mLs (2.5 mg total) by nebulization every 6 (six) hours as needed for wheezing or shortness of breath.   albuterol 108 (90 Base) MCG/ACT inhaler Commonly known as: VENTOLIN HFA Inhale 2 puffs into the lungs every 6 (six) hours as needed for wheezing or shortness of breath.   coconut oil Oil Apply 1 application topically as needed.   fluticasone 110 MCG/ACT inhaler Commonly known as: FLOVENT HFA Inhale 2 puffs into the lungs 2 (two) times daily.   fluticasone 50 MCG/ACT nasal spray Commonly known as: FLONASE Place 1 spray into both nostrils daily.   iron polysaccharides 150 MG capsule Commonly known as: Ferrex 150 Take 1 capsule (150 mg total) by mouth daily.   Magnesium Oxide 400 (240 Mg) MG Tabs Take 1 tablet (400 mg total) by mouth daily. For prevention of constipation.   Oxycodone HCl 10 MG Tabs Take 1 tablet (10 mg total) by mouth every 6 (six) hours as  needed for moderate pain.   prenatal multivitamin Tabs tablet Take 1 tablet by mouth daily at 12 noon.   senna-docusate 8.6-50 MG tablet Commonly known as: Senokot-S Take 2 tablets by mouth daily. Start taking on: December 08, 2019   simethicone 80 MG chewable tablet Commonly known as: MYLICON Chew 1 tablet (80 mg total) by mouth as needed for flatulence.       Diet: routine diet  Activity: Advance as tolerated. Pelvic rest for 6 weeks.   Postpartum contraception: Not Discussed  Newborn  Data: Live born female  Birth Weight: 7 lb 6.5 oz (3360 g) APGAR: 7, 9  Newborn Delivery   Birth date/time: 12/03/2019 10:11:00 Delivery type: C-Section, Low Transverse Trial of labor: No C-section categorization: Repeat      named Virl Cagey. Baby Feeding: Breast Disposition:home with mother   Delivery Report:  Review the Delivery Report for details.    Follow up:  Follow-up Information    Shea Evans, MD. Schedule an appointment as soon as possible for a visit in 6 week(s).   Specialty: Obstetrics and Gynecology Contact information: [address] [place] [address] [phone]                 Signed: Cipriano Mile, MSN 12/07/2019, 11:15 AM

## 2019-12-07 NOTE — Discharge Instructions (Signed)
Lactation outpatient support - home visit ° ° °Linda Coppola °RN, MHA, IBCLC °at [facility]: Lactation [facility] ° °[email or web address] ° °

## 2019-12-11 ENCOUNTER — Inpatient Hospital Stay (HOSPITAL_COMMUNITY): Payer: Managed Care, Other (non HMO)

## 2019-12-11 ENCOUNTER — Encounter (HOSPITAL_COMMUNITY): Payer: Self-pay | Admitting: Obstetrics and Gynecology

## 2019-12-11 ENCOUNTER — Encounter (HOSPITAL_COMMUNITY): Payer: Self-pay

## 2019-12-11 ENCOUNTER — Other Ambulatory Visit: Payer: Self-pay

## 2019-12-11 ENCOUNTER — Encounter (HOSPITAL_COMMUNITY): Payer: Self-pay | Admitting: Obstetrics & Gynecology

## 2019-12-11 ENCOUNTER — Inpatient Hospital Stay (HOSPITAL_COMMUNITY)
Admission: AD | Admit: 2019-12-11 | Discharge: 2019-12-11 | Disposition: A | Discharge status: Home / Self Care | Payer: Managed Care, Other (non HMO) | Attending: Obstetrics & Gynecology | Admitting: Obstetrics & Gynecology

## 2019-12-11 DIAGNOSIS — O9853 Other viral diseases complicating the puerperium: Secondary | ICD-10-CM | POA: Diagnosis not present

## 2019-12-11 DIAGNOSIS — R0602 Shortness of breath: Secondary | ICD-10-CM

## 2019-12-11 DIAGNOSIS — D573 Sickle-cell trait: Secondary | ICD-10-CM | POA: Insufficient documentation

## 2019-12-11 DIAGNOSIS — U071 COVID-19: Secondary | ICD-10-CM | POA: Diagnosis not present

## 2019-12-11 DIAGNOSIS — Z79899 Other long term (current) drug therapy: Secondary | ICD-10-CM | POA: Diagnosis not present

## 2019-12-11 DIAGNOSIS — Z7951 Long term (current) use of inhaled steroids: Secondary | ICD-10-CM | POA: Insufficient documentation

## 2019-12-11 DIAGNOSIS — O99893 Other specified diseases and conditions complicating puerperium: Secondary | ICD-10-CM | POA: Diagnosis present

## 2019-12-11 LAB — CBC
HCT: 34.3 % — ABNORMAL LOW (ref 36.0–46.0)
Hemoglobin: 11.4 g/dL — ABNORMAL LOW (ref 12.0–15.0)
MCH: 28.1 pg (ref 26.0–34.0)
MCHC: 33.2 g/dL (ref 30.0–36.0)
MCV: 84.5 fL (ref 80.0–100.0)
Platelets: 296 10*3/uL (ref 150–400)
RBC: 4.06 MIL/uL (ref 3.87–5.11)
RDW: 16.1 % — ABNORMAL HIGH (ref 11.5–15.5)
WBC: 9.1 10*3/uL (ref 4.0–10.5)
nRBC: 0.4 % — ABNORMAL HIGH (ref 0.0–0.2)

## 2019-12-11 LAB — SARS CORONAVIRUS 2 BY RT PCR (HOSPITAL ORDER, PERFORMED IN ~~LOC~~ HOSPITAL LAB): SARS Coronavirus 2: POSITIVE — AB

## 2019-12-11 LAB — COMPREHENSIVE METABOLIC PANEL
ALT: 24 U/L (ref 0–44)
AST: 43 U/L — ABNORMAL HIGH (ref 15–41)
Albumin: 2.8 g/dL — ABNORMAL LOW (ref 3.5–5.0)
Alkaline Phosphatase: 62 U/L (ref 38–126)
Anion gap: 12 (ref 5–15)
BUN: <5 mg/dL — ABNORMAL LOW (ref 6–20)
CO2: 21 mmol/L — ABNORMAL LOW (ref 22–32)
Calcium: 8.5 mg/dL — ABNORMAL LOW (ref 8.9–10.3)
Chloride: 106 mmol/L (ref 98–111)
Creatinine, Ser: 0.71 mg/dL (ref 0.44–1.00)
GFR calc Af Amer: >60 mL/min (ref >60)
GFR calc non Af Amer: >60 mL/min (ref >60)
Glucose, Bld: 73 mg/dL (ref 70–99)
Potassium: 4.2 mmol/L (ref 3.5–5.1)
Sodium: 139 mmol/L (ref 135–145)
Total Bilirubin: 0.4 mg/dL (ref 0.3–1.2)
Total Protein: 6.9 g/dL (ref 6.5–8.1)

## 2019-12-11 LAB — FERRITIN: Ferritin: 833 ng/mL — ABNORMAL HIGH (ref 11–307)

## 2019-12-11 LAB — BRAIN NATRIURETIC PEPTIDE: B Natriuretic Peptide: 42.7 pg/mL (ref 0.0–100.0)

## 2019-12-11 MED ORDER — SODIUM CHLORIDE 0.9% FLUSH: 3.0000 mL | Freq: Two times a day (BID) | INTRAVENOUS | Status: DC

## 2019-12-11 MED ORDER — SODIUM CHLORIDE 0.9 % IV SOLN: 250.0000 mL | INTRAVENOUS | Status: DC | PRN

## 2019-12-11 MED ORDER — SODIUM CHLORIDE 0.9% FLUSH: 3.0000 mL | INTRAVENOUS | Status: DC | PRN

## 2019-12-11 MED ORDER — IOHEXOL 350 MG/ML SOLN
55.0000 mL | Freq: Once | INTRAVENOUS | Status: AC | PRN
  Administered 2019-12-11: 55 mL via INTRAVENOUS

## 2019-12-11 MED ORDER — ACETAMINOPHEN 325 MG PO TABS
650.0000 mg | ORAL_TABLET | Freq: Once | ORAL | Status: AC
  Administered 2019-12-11: 650 mg via ORAL
  Filled 2019-12-11: qty 2

## 2019-12-11 NOTE — MAU Note (Signed)
Melissa Joyce is a 34 y.o. here in MAU reporting: PP via c/s on 12/03/2019. States she has been having SOB since yesterday. Has had asthma with pregnancy so she tried a breathing treatment at home with no relief. Also having chest pain and increased swelling.   Onset of complaint: yesterday  Pain score: 8/10  Vitals:   12/11/19 1346  BP: 124/76  Pulse: 89  Resp: (!) 30  Temp: 99.5 F (37.5 C)  SpO2: 100%     Lab orders placed from triage: none

## 2019-12-11 NOTE — MAU Note (Signed)
Patient reporting a decreased sense of smell over the last few days. Dr. Barb Merino notified and patient placed on airborne precautions.

## 2019-12-11 NOTE — Discharge Instructions (Signed)
You were seen today for concern of difficulty breathing, chest pain and leg swelling. Your test for COVID infection was positive. No evidence of pneumonia on your chest x-ray. No evidence of blood clot on your CT Chest. Your anemia was improved from prior. We recommend that you continue breastfeeding. It will be very important to go to the Main Emergency Room if concern of worsening SOB, Chest Pain or other concerning symptoms. A nurse from your prenatal clinic will call you to check in but please call sooner if concerns.  Isolation Instructions for Active COVID-19 Infection  Given your positive COVID-19 result:  Stay home until after ? At least 10 days since symptoms first appeared and ? At least 24 hours with no fever without fever-reducing medication and ? Symptoms have improved ? It is safe and recommended to continue breastfeeding. Your breast milk has antibodies that can protect against the COVID infection.  ? You should wear a mask at all times when you are in close contact to your baby and others living in your home. ? It will be important to return to the Emergency Room if you develop worsening shortness of breath, worsening chest pain, extreme fatigue, or other concerning symptoms. ? You may continue using your albuterol inhaler as needed.  If you live with others, stay in a specific "sick room" or area and away from other people or animals, including pets. Use a separate bathroom, if available. [employer] 11/22/2018 This information is not intended to replace advice given to you by your health care provider. Make sure you discuss any questions you have with your health care provider. Document Revised: 04/07/2019 Document Reviewed: 04/07/2019 Elsevier Patient Education  2020 Elsevier Inc.   COVID-19 COVID-19 is a respiratory infection that is caused by a virus called severe acute respiratory syndrome coronavirus 2 (SARS-CoV-2). The disease is also known as coronavirus  disease or novel coronavirus. In some people, the virus may not cause any symptoms. In others, it may cause a serious infection. The infection can get worse quickly and can lead to complications, such as:  Pneumonia, or infection of the lungs.  Acute respiratory distress syndrome or ARDS. This is a condition in which fluid build-up in the lungs prevents the lungs from filling with air and passing oxygen into the blood.  Acute respiratory failure. This is a condition in which there is not enough oxygen passing from the lungs to the body or when carbon dioxide is not passing from the lungs out of the body.  Sepsis or septic shock. This is a serious bodily reaction to an infection.  Blood clotting problems.  Secondary infections due to bacteria or fungus.  Organ failure. This is when your body's organs stop working. The virus that causes COVID-19 is contagious. This means that it can spread from person to person through droplets from coughs and sneezes (respiratory secretions). What are the causes? This illness is caused by a virus. You may catch the virus by:  Breathing in droplets from an infected person. Droplets can be spread by a person breathing, speaking, singing, coughing, or sneezing.  Touching something, like a table or a doorknob, that was exposed to the virus (contaminated) and then touching your mouth, nose, or eyes. What increases the risk? Risk for infection You are more likely to be infected with this virus if you:  Are within 6 feet (2 meters) of a person with COVID-19.  Provide care for or live with a person who is infected with COVID-19.  Spend time in crowded indoor spaces or live in shared housing. Risk for serious illness You are more likely to become seriously ill from the virus if you:  Are 14 years of age or older. The higher your age, the more you are at risk for serious illness.  Live in a nursing home or long-term care facility.  Have cancer.  Have a  long-term (chronic) disease such as: ? Chronic lung disease, including chronic obstructive pulmonary disease or asthma. ? A long-term disease that lowers your body's ability to fight infection (immunocompromised). ? Heart disease, including heart failure, a condition in which the arteries that lead to the heart become narrow or blocked (coronary artery disease), a disease which makes the heart muscle thick, weak, or stiff (cardiomyopathy). ? Diabetes. ? Chronic kidney disease. ? Sickle cell disease, a condition in which red blood cells have an abnormal "sickle" shape. ? Liver disease.  Are obese. What are the signs or symptoms? Symptoms of this condition can range from mild to severe. Symptoms may appear any time from 2 to 14 days after being exposed to the virus. They include:  A fever or chills.  A cough.  Difficulty breathing.  Headaches, body aches, or muscle aches.  Runny or stuffy (congested) nose.  A sore throat.  New loss of taste or smell. Some people may also have stomach problems, such as nausea, vomiting, or diarrhea. Other people may not have any symptoms of COVID-19. How is this diagnosed? This condition may be diagnosed based on:  Your signs and symptoms, especially if: ? You live in an area with a COVID-19 outbreak. ? You recently traveled to or from an area where the virus is common. ? You provide care for or live with a person who was diagnosed with COVID-19. ? You were exposed to a person who was diagnosed with COVID-19.  A physical exam.  Lab tests, which may include: ? Taking a sample of fluid from the back of your nose and throat (nasopharyngeal fluid), your nose, or your throat using a swab. ? A sample of mucus from your lungs (sputum). ? Blood tests.  Imaging tests, which may include, X-rays, CT scan, or ultrasound. How is this treated? At present, there is no medicine to treat COVID-19. Medicines that treat other diseases are being used on a  trial basis to see if they are effective against COVID-19. Your health care provider will talk with you about ways to treat your symptoms. For most people, the infection is mild and can be managed at home with rest, fluids, and over-the-counter medicines. Treatment for a serious infection usually takes places in a hospital intensive care unit (ICU). It may include one or more of the following treatments. These treatments are given until your symptoms improve.  Receiving fluids and medicines through an IV.  Supplemental oxygen. Extra oxygen is given through a tube in the nose, a face mask, or a hood.  Positioning you to lie on your stomach (prone position). This makes it easier for oxygen to get into the lungs.  Continuous positive airway pressure (CPAP) or bi-level positive airway pressure (BPAP) machine. This treatment uses mild air pressure to keep the airways open. A tube that is connected to a motor delivers oxygen to the body.  Ventilator. This treatment moves air into and out of the lungs by using a tube that is placed in your windpipe.  Tracheostomy. This is a procedure to create a hole in the neck so that a breathing  tube can be inserted.  Extracorporeal membrane oxygenation (ECMO). This procedure gives the lungs a chance to recover by taking over the functions of the heart and lungs. It supplies oxygen to the body and removes carbon dioxide. Follow these instructions at home: Lifestyle  If you are sick, stay home except to get medical care. Your health care provider will tell you how long to stay home. Call your health care provider before you go for medical care.  Rest at home as told by your health care provider.  Do not use any products that contain nicotine or tobacco, such as cigarettes, e-cigarettes, and chewing tobacco. If you need help quitting, ask your health care provider.  Return to your normal activities as told by your health care provider. Ask your health care  provider what activities are safe for you. General instructions  Take over-the-counter and prescription medicines only as told by your health care provider.  Drink enough fluid to keep your urine pale yellow.  Keep all follow-up visits as told by your health care provider. This is important. How is this prevented?  There is no vaccine to help prevent COVID-19 infection. However, there are steps you can take to protect yourself and others from this virus. To protect yourself:   Do not travel to areas where COVID-19 is a risk. The areas where COVID-19 is reported change often. To identify high-risk areas and travel restrictions, check the CDC travel website: StageSync.si  If you live in, or must travel to, an area where COVID-19 is a risk, take precautions to avoid infection. ? Stay away from people who are sick. ? Wash your hands often with soap and water for 20 seconds. If soap and water are not available, use an alcohol-based hand sanitizer. ? Avoid touching your mouth, face, eyes, or nose. ? Avoid going out in public, follow guidance from your state and local health authorities. ? If you must go out in public, wear a cloth face covering or face mask. Make sure your mask covers your nose and mouth. ? Avoid crowded indoor spaces. Stay at least 6 feet (2 meters) away from others. ? Disinfect objects and surfaces that are frequently touched every day. This may include:  Counters and tables.  Doorknobs and light switches.  Sinks and faucets.  Electronics, such as phones, remote controls, keyboards, computers, and tablets. To protect others: If you have symptoms of COVID-19, take steps to prevent the virus from spreading to others.  If you think you have a COVID-19 infection, contact your health care provider right away. Tell your health care team that you think you may have a COVID-19 infection.  Stay home. Leave your house only to seek medical care. Do not use public  transport.  Do not travel while you are sick.  Wash your hands often with soap and water for 20 seconds. If soap and water are not available, use alcohol-based hand sanitizer.  Stay away from other members of your household. Let healthy household members care for children and pets, if possible. If you have to care for children or pets, wash your hands often and wear a mask. If possible, stay in your own room, separate from others. Use a different bathroom.  Make sure that all people in your household wash their hands well and often.  Cough or sneeze into a tissue or your sleeve or elbow. Do not cough or sneeze into your hand or into the air.  Wear a cloth face covering or face mask.  Make sure your mask covers your nose and mouth. Where to find more information  Centers for Disease Control and Prevention: [employer]ickerEm[employer]ium.tnwww.cdc.gov/coronavirus/2019-ncov/index.html  World Health Organization: https://thompson-craig.com/www.who.int/health-topics/coronavirus Contact a health care provider if:  You live in or have traveled to an area where COVID-19 is a risk and you have symptoms of the infection.  You have had contact with someone who has COVID-19 and you have symptoms of the infection. Get help right away if:  You have trouble breathing.  You have pain or pressure in your chest.  You have confusion.  You have bluish lips and fingernails.  You have difficulty waking from sleep.  You have symptoms that get worse. These symptoms may represent a serious problem that is an emergency. Do not wait to see if the symptoms will go away. Get medical help right away. Call your local emergency services (911 in the [place] Do not drive yourself to the hospital. Let the emergency medical personnel know if you think you have COVID-19. Summary  COVID-19 is a respiratory infection that is caused by a virus. It is also known as coronavirus disease or novel coronavirus. It can cause serious infections, such as pneumonia, acute respiratory  distress syndrome, acute respiratory failure, or sepsis.  The virus that causes COVID-19 is contagious. This means that it can spread from person to person through droplets from breathing, speaking, singing, coughing, or sneezing.  You are more likely to develop a serious illness if you are 33 years of age or older, have a weak immune system, live in a nursing home, or have chronic disease.  There is no medicine to treat COVID-19. Your health care provider will talk with you about ways to treat your symptoms.  Take steps to protect yourself and others from infection. Wash your hands often and disinfect objects and surfaces that are frequently touched every day. Stay away from people who are sick and wear a mask if you are sick. This information is not intended to replace advice given to you by your health care provider. Make sure you discuss any questions you have with your health care provider. Document Revised: 02/18/2019 Document Reviewed: 05/27/2018 Elsevier Patient Education  2020 [employer]

## 2019-12-11 NOTE — MAU Provider Note (Signed)
History   CSN: [id number]  Arrival date and time: 12/11/19 1328  Chief Complaint  Patient presents with   Chest Pain   Shortness of Breath   HPI  Ms. Melissa Joyce is a 34yo [id number] now POD#9 s/p repeat elective CS on 12/03/19 who presents to MAU with concern of exertional SOB, chest pain and increased bilateral leg swelling since yesterday evening. Pt's postpartum course was complicated by a significant drop in hemoglobin that did not correspond with quantified blood loss in surgery (Pre-op Hgb 13.4 > POD#1 Hgb 7.2). In addition she developed tachycardia and SOB on PPD#2. Discharge summary did note concern for possible abdominal wall hematoma, but no imaging was performed. Chest x-ray on POD#2 showed "low lung volumes with bronchovascular crowding versus vascular congestion" per radiology read on 8/1. She received 3u pRBCs and was started on flovent and ventolin nebulizing treatments (asthma diagnosed in seventh month of recent pregnancy). On POD#3 her Hgb stabilized at 9.0 and her symptoms improved. No history of blood pressure issues in pregnancy.  Pt reports that she was overall improving until yesterday evening. She has tried taking her flovent inhaler without any relief. She is unsure if she has used her albuterol inhaler. She also reports sharp chest pain in addition to congestion that has affected her ability to smell. No loss of taste, cough, sore throat, or wheeze. No known sick contacts. She reports that she never received the iron supplement that was prescribed at time of discharge. No clinic appointments yet s/p discharge. Of note, no personal history of blood clots but her mother did have a blood clot in her 30s.   Past Medical History:  Diagnosis Date   Allergic rhinitis    Anemia    Chorioamnionitis 09/23/2012   Postpartum care following cesarean delivery (09/22/12) 09/23/2012   Pregnant    S/P repeat low transverse C-section 09/23/2012   Sickle cell trait [facility])     Past  Surgical History:  Procedure Laterality Date   CESAREAN SECTION  2005   CESAREAN SECTION N/A 09/22/2012   Procedure: CESAREAN SECTION;  Surgeon: Serita Kyle, MD;  Location: [facility] ORS;  Service: Obstetrics;  Laterality: N/A;   CESAREAN SECTION N/A 12/03/2019   Procedure: REPEAT CESAREAN SECTION ;  Surgeon: Shea Evans, MD;  Location: [facility] ORS;  Service: Obstetrics;  Laterality: N/A;    Family History  Problem Relation Age of Onset   Heart disease Father    Anemia Mother    Cervical cancer Mother    Cervical cancer Maternal Grandmother    Skin cancer Maternal Grandmother    Ovarian cancer Other    Asthma Sister    Asthma Brother    Asthma Daughter    Stroke Paternal Grandmother    Hypertension Paternal Grandmother    Stroke Paternal Grandfather    Hypertension Paternal Grandfather    Other Neg Hx     Social History   Tobacco Use   Smoking status: Never Smoker   Smokeless tobacco: Never Used  Building services engineer Use: Never used  Substance Use Topics   Alcohol use: Not Currently    Comment: wine on ocassion   Drug use: No    Allergies:  Allergies  Allergen Reactions   Bee Venom Swelling   Shrimp [Shellfish Allergy] Hives    States only "steamed shrimp". Other ways of cooking shrimp are fine.     Medications Prior to Admission  Medication Sig Dispense Refill Last Dose   acetaminophen (TYLENOL) 500 MG  tablet Take 2 tablets (1,000 mg total) by mouth every 6 (six) hours. 30 tablet 0 12/11/2019 at Unknown time   albuterol (PROVENTIL) (2.5 MG/3ML) 0.083% nebulizer solution Take 3 mLs (2.5 mg total) by nebulization every 6 (six) hours as needed for wheezing or shortness of breath. 75 mL 12 12/11/2019 at Unknown time   albuterol (VENTOLIN HFA) 108 (90 Base) MCG/ACT inhaler Inhale 2 puffs into the lungs every 6 (six) hours as needed for wheezing or shortness of breath. 18 g 5 12/10/2019 at Unknown time   fluticasone (FLOVENT HFA) 110 MCG/ACT  inhaler Inhale 2 puffs into the lungs 2 (two) times daily. 1 Inhaler 12 12/11/2019 at Unknown time   oxyCODONE 10 MG TABS Take 1 tablet (10 mg total) by mouth every 6 (six) hours as needed for moderate pain. 30 tablet 0 12/10/2019 at Unknown time   Prenatal Vit-Fe Fumarate-FA (PRENATAL MULTIVITAMIN) TABS tablet Take 1 tablet by mouth daily at 12 noon.   12/11/2019 at Unknown time   simethicone (MYLICON) 80 MG chewable tablet Chew 1 tablet (80 mg total) by mouth as needed for flatulence. 30 tablet 0 12/11/2019 at Unknown time   coconut oil OIL Apply 1 application topically as needed.  0 Unknown at Unknown time   fluticasone (FLONASE) 50 MCG/ACT nasal spray Place 1 spray into both nostrils daily. 16 g 2    iron polysaccharides (FERREX 150) 150 MG capsule Take 1 capsule (150 mg total) by mouth daily.   Unknown at Unknown time   Magnesium Oxide 400 (240 Mg) MG TABS Take 1 tablet (400 mg total) by mouth daily. For prevention of constipation. 30 tablet  Unknown at Unknown time   senna-docusate (SENOKOT-S) 8.6-50 MG tablet Take 2 tablets by mouth daily.   Unknown at Unknown time    Review of Systems  Constitutional: Negative for activity change, chills and fever.  HENT: Positive for congestion. Negative for sneezing and sore throat.   Eyes: Negative for photophobia and visual disturbance.  Respiratory: Positive for cough, chest tightness and shortness of breath. Negative for wheezing.   Cardiovascular: Positive for chest pain and leg swelling. Negative for palpitations.  Gastrointestinal: Positive for abdominal pain.  Genitourinary: Negative for dysuria, flank pain, vaginal bleeding, vaginal discharge and vaginal pain.  Musculoskeletal: Negative for back pain.  Skin: Negative for rash.  Neurological: Negative for seizures and headaches.  Hematological: Does not bruise/bleed easily.   Physical Exam   Blood pressure (!) 133/54, pulse 80, temperature 99.5 F (37.5 C), temperature source Oral, resp.  rate (!) 30, height 5\' 1"  (1.549 m), weight 107.5 kg, SpO2 100 %, unknown if currently breastfeeding.  Physical Exam Constitutional:      General: She is not in acute distress.    Appearance: She is well-developed. She is obese.  HENT:     Head: Normocephalic and atraumatic.  Eyes:     Extraocular Movements: Extraocular movements intact.  Cardiovascular:     Rate and Rhythm: Normal rate and regular rhythm.     Pulses:          Radial pulses are 2+ on the right side and 2+ on the left side.     Heart sounds: Normal heart sounds.  Pulmonary:     Effort: Pulmonary effort is normal. Tachypnea present.     Breath sounds: Normal breath sounds. No stridor. No decreased breath sounds, wheezing, rhonchi or rales.  Chest:     Chest wall: No deformity or edema.  Abdominal:  General: Bowel sounds are normal.     Palpations: Abdomen is soft.     Tenderness: There is abdominal tenderness. There is no rebound.  Musculoskeletal:     Cervical back: Normal range of motion and neck supple.     Right lower leg: No tenderness. Edema present.     Left lower leg: No tenderness. Edema present.     Comments: Slow gait secondary to pain  Skin:    General: Skin is warm and dry.     Capillary Refill: Capillary refill takes 2 to 3 seconds.  Neurological:     General: No focal deficit present.     Mental Status: She is alert and oriented to person, place, and time.  Psychiatric:        Mood and Affect: Mood normal.        Behavior: Behavior normal.     MAU Course   MDM Reassuringly, pt afebrile with stable vital signs and 100% O2 saturation. However, given pt's report of worsening exertional dyspnea, CP, and 2+ pitting edema in lower extremities bilaterally, most concerning for recurrent severe anemia vs pulmonary infection and possible volume overload. Less likely PE given Low Risk Well's Score of 1.5 points (risk factor of recent surgery). Good airflow without wheeze or crackles on pulmonary exam  so asthma exacerbation and pneumonia are also less likely. Will also obtain EKG to evaluate for possible right-heart strain and r/o ACS. Although notable swelling and cardiorespiratory symptoms as described no elevated blood pressures concerning for preeclampsia.  Will obtain rapid COVID swab in addition to labs (CBC, CMP, BNP, ferritin) and CXR to evaluate for possible infection vs cardiac etiology vs worsening anemia.  1700: Resulted labs: WBC wnl. H&H 11.4 & 34.3, improved from day of discharge. BNP wnl and ferritin 833. CMP notable for CO@ 21, Ca 8.5, Albumin 2.8, AST 43. Given positive rapid COVID swab, will proceed with portable CXR and CTA to r/o PE. Updated pt with results and need for imaging to evaluate for possible pneumonia and PE given positive COVID status. Pt reports understanding of plan. Breast pump at bedside.   1715: CXR unremarkable. Will await CTA.  2100: CTA without evidence of PE but does show mild multifocal infiltrates scattered throughout both lungs, slightly more prominent within the bilateral lower lobes. Discussed findings with pt and her partner. Given no oxygen requirement, initially offered discharge home to be with infant. Also discussed benefits of breastfeeding and recommendations for quarantine and masking around other family members. Also discussed pt with Dr. Juliene PinaMody (prenatal provider), who plans to have her nurse follow-up with the patient tomorrow (12/12/19). Overall, pt and her partner frustrated with the situation and concerned about discharge home given ongoing SOB and multiple children, including newborn, at home.   Assessment and Plan   Ms. Melissa Joyce is a 33yo [id number] now POD#9 s/p repeat elective CS on 12/03/19 who presents to MAU with concern of exertional SOB, chest pain and increased bilateral leg swelling since yesterday evening. Workup showed positive COVID but no evidence of pneumonia or PE on CXR and CTA respectively. Pt with improved Hgb compared to time of  recent discharge. Continuous oxygen monitoring in MAU consistently showed O2 sat 100% without desaturations with ambulation.  Dr. Macon LargeAnyanwu was consulted and came to discuss the recommendations with the pt and her partner as well. Given pt's desire for admission, hospitalist team was contacted but pt was ultimately declined for admission given that she does not meet criteria for COVID admission. Provided  strict return precautions for worsening SOB, worsening chest pain, extreme fatigue or other concerning symptoms. Also discussed quarantine recommendations and need for masking at home as noted in discharge instructions. Plan for close f/u with prenatal provider's clinic on day following discharge. Prenatal provider will also follow-up with pt regarding potential outpatient treatment options (monoclonal antibodies, remdesivir).  Sheila Oats, MD OB Fellow, [facility] 12/11/2019 10:00 PM

## 2021-04-02 ENCOUNTER — Other Ambulatory Visit: Payer: Self-pay

## 2021-04-02 ENCOUNTER — Encounter: Payer: Self-pay | Admitting: Neurology

## 2021-04-02 ENCOUNTER — Ambulatory Visit (INDEPENDENT_AMBULATORY_CARE_PROVIDER_SITE_OTHER): Payer: Managed Care, Other (non HMO) | Admitting: Neurology

## 2021-04-02 VITALS — Ht 61.0 in | Wt 234.0 lb

## 2021-04-02 DIAGNOSIS — G44209 Tension-type headache, unspecified, not intractable: Secondary | ICD-10-CM

## 2021-04-02 DIAGNOSIS — R0683 Snoring: Secondary | ICD-10-CM | POA: Diagnosis not present

## 2021-04-02 DIAGNOSIS — F0781 Postconcussional syndrome: Secondary | ICD-10-CM | POA: Diagnosis not present

## 2021-04-02 MED ORDER — TOPIRAMATE 25 MG PO TABS
25.0000 mg | ORAL_TABLET | Freq: Two times a day (BID) | ORAL | 0 refills | Status: DC
Start: 2021-04-02 — End: 2021-10-23

## 2021-04-02 NOTE — Progress Notes (Signed)
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  Patient report a fall at work, she reports sitting on her desk and next thing that she knows is that she is on the floor with people around her.  She was taken to a local hospital, was told that she had a syncope due to iron deficiency anemia.  She reported having a head CT head and was told that everything was normal.  Since then, she has been complaining of headaches, described headaches as bifrontal pounding headache, symptoms associated with dizziness and nausea.  She has 4 to 5 days of headache per week.  Headaches usually happen at the end of a working day.  She has not tried any preventive medication but report Tylenol or ibuprofen helps with the pain.  She has seen an ophthalmologist, has new pair of glasses but no changes in her headache frequency.  On top of the headaches, she also complains of memory difficulties described as being forgetful, she has to write everything down or she will forget.  She is still very independent, works as an Social research officer, government at [employer], able to function appropriately, no loss of productivity.  She currently drives, denies being lost in familiar places, is handling her finances and does not have any issue with names of people familiar to her.   She also reports sleeping difficulty, describes as unable to get a full night of rest, sister has complaints of loud snoring.     OTHER MEDICAL  CONDITIONS: Fe deficiency deficiency    REVIEW OF SYSTEMS: Full 14 system review of systems performed and negative with exception of: as noted in the HPI.   ALLERGIES: Allergies  Allergen Reactions   Bee Venom Swelling   Shrimp [Shellfish Allergy] Hives    States only "steamed shrimp". Other ways of cooking shrimp are fine.     HOME MEDICATIONS: Outpatient Medications Prior to Visit  Medication Sig Dispense Refill   fluticasone (FLONASE) 50 MCG/ACT nasal spray Place 1 spray into both nostrils daily. 16 g 2   iron polysaccharides (FERREX 150) 150 MG capsule Take 1 capsule (150 mg total) by mouth daily.     acetaminophen (TYLENOL) 500 MG tablet Take 2 tablets (1,000 mg total) by mouth every 6 (six) hours. 30 tablet 0   albuterol (PROVENTIL) (2.5 MG/3ML) 0.083% nebulizer solution Take 3 mLs (2.5 mg total) by nebulization every 6 (six) hours as needed for wheezing or shortness of breath. 75 mL 12   albuterol (VENTOLIN HFA) 108 (90 Base) MCG/ACT inhaler Inhale 2 puffs into the lungs every 6 (six) hours as needed for wheezing or shortness of breath. 18 g 5   coconut oil OIL Apply 1 application topically as needed.  0   fluticasone (FLOVENT HFA) 110 MCG/ACT inhaler Inhale 2 puffs into the lungs 2 (two) times daily. 1 Inhaler 12   Magnesium Oxide 400 (240 Mg) MG TABS Take 1 tablet (400 mg total) by mouth daily. For prevention of constipation. 30 tablet  oxyCODONE 10 MG TABS Take 1 tablet (10 mg total) by mouth every 6 (six) hours as needed for moderate pain. 30 tablet 0   Prenatal Vit-Fe Fumarate-FA (PRENATAL MULTIVITAMIN) TABS tablet Take 1 tablet by mouth daily at 12 noon.     senna-docusate (SENOKOT-S) 8.6-50 MG tablet Take 2 tablets by mouth daily.     simethicone (MYLICON) 80 MG chewable tablet Chew 1 tablet (80 mg total) by mouth as needed for flatulence. 30 tablet 0   No facility-administered medications prior to visit.    PAST MEDICAL HISTORY: Past Medical History:  Diagnosis Date    Allergic rhinitis    Anemia    Chorioamnionitis 09/23/2012   Postpartum care following cesarean delivery (09/22/12) 09/23/2012   Pregnant    S/P repeat low transverse C-section 09/23/2012   Sickle cell trait (HCC)     PAST SURGICAL HISTORY: Past Surgical History:  Procedure Laterality Date   CESAREAN SECTION  2005   CESAREAN SECTION N/A 09/22/2012   Procedure: CESAREAN SECTION;  Surgeon: Serita Kyle, MD;  Location: [facility];  Service: Obstetrics;  Laterality: N/A;   CESAREAN SECTION N/A 12/03/2019   Procedure: REPEAT CESAREAN SECTION ;  Surgeon: Shea Evans, MD;  Location: [facility];  Service: Obstetrics;  Laterality: N/A;    FAMILY HISTORY: Family History  Problem Relation Age of Onset   Heart disease Father    Anemia Mother    Cervical cancer Mother    Cervical cancer Maternal Grandmother    Skin cancer Maternal Grandmother    Ovarian cancer Other    Asthma Sister    Asthma Brother    Asthma Daughter    Stroke Paternal Grandmother    Hypertension Paternal Grandmother    Stroke Paternal Grandfather    Hypertension Paternal Grandfather    Other Neg Hx     SOCIAL HISTORY: Social History   Socioeconomic History   Marital status: Media planner    Spouse name: Not on file   Number of children: Not on file   Years of education: Not on file   Highest education level: Not on file  Occupational History   Not on file  Tobacco Use   Smoking status: Never   Smokeless tobacco: Never  Vaping Use   Vaping Use: Never used  Substance and Sexual Activity   Alcohol use: Not Currently    Comment: wine on ocassion   Drug use: No   Sexual activity: Yes  Other Topics Concern   Not on file  Social History Narrative   Not on file   Social Determinants of Health   Financial Resource Strain: Not on file  Food Insecurity: Not on file  Transportation Needs: Not on file  Physical Activity: Not on file  Stress: Not on file  Social Connections: Not on file  Intimate  Partner Violence: Not on file    PHYSICAL EXAM  GENERAL EXAM/CONSTITUTIONAL: Vitals:  Vitals:   04/02/21 0818  Weight: 234 lb (106.1 kg)  Height: 5\' 1"  (1.549 m)   Body mass index is 44.21 kg/m. Wt Readings from Last 3 Encounters:  04/02/21 234 lb (106.1 kg)  12/11/19 237 lb 1.6 oz (107.5 kg)  12/03/19 (!) 254 lb (115.2 kg)   Patient is in no distress; well developed, nourished and groomed; neck is supple  CARDIOVASCULAR: Examination of carotid arteries is normal; no carotid bruits Regular rate and rhythm, no murmurs Examination of peripheral vascular system by observation and palpation is normal  EYES: Pupils round and  reactive to light, Visual fields full to confrontation, Extraocular movements intacts,   MUSCULOSKELETAL: Gait, strength, tone, movements noted in Neurologic exam below  NEUROLOGIC: MENTAL STATUS:  No flowsheet data found. awake, alert, oriented to person, place and time recent and remote memory intact normal attention and concentration language fluent, comprehension intact, naming intact fund of knowledge appropriate  CRANIAL NERVE:  2nd - no papilledema or hemorrhages on fundoscopic exam 2nd, 3rd, 4th, 6th - pupils equal and reactive to light, visual fields full to confrontation, extraocular muscles intact, no nystagmus 5th - facial sensation symmetric 7th - facial strength symmetric 8th - hearing intact 9th - palate elevates symmetrically, uvula midline 11th - shoulder shrug symmetric 12th - tongue protrusion midline  MOTOR:  normal bulk and tone, full strength in the BUE, BLE  SENSORY:  normal and symmetric to light touch, pinprick, temperature, vibration  COORDINATION:  finger-nose-finger, fine finger movements normal  REFLEXES:  deep tendon reflexes present and symmetric  GAIT/STATION:  normal   DIAGNOSTIC DATA (LABS, IMAGING, TESTING) - I reviewed patient records, labs, notes, testing and imaging myself where available.  Lab  Results  Component Value Date   WBC 9.1 12/11/2019   HGB 11.4 (L) 12/11/2019   HCT 34.3 (L) 12/11/2019   MCV 84.5 12/11/2019   PLT 296 12/11/2019      Component Value Date/Time   NA 139 12/11/2019 1501   K 4.2 12/11/2019 1501   CL 106 12/11/2019 1501   CO2 21 (L) 12/11/2019 1501   GLUCOSE 73 12/11/2019 1501   BUN <5 (L) 12/11/2019 1501   CREATININE 0.71 12/11/2019 1501   CALCIUM 8.5 (L) 12/11/2019 1501   PROT 6.9 12/11/2019 1501   ALBUMIN 2.8 (L) 12/11/2019 1501   AST 43 (H) 12/11/2019 1501   ALT 24 12/11/2019 1501   ALKPHOS 62 12/11/2019 1501   BILITOT 0.4 12/11/2019 1501   GFRNONAA >60 12/11/2019 1501   GFRAA >60 12/11/2019 1501   No results found for: CHOL, HDL, LDLCALC, LDLDIRECT, TRIG, CHOLHDL No results found for: YQMV7Q No results found for: VITAMINB12 No results found for: TSH    ASSESSMENT AND PLAN  35 y.o. year old female with iron deficiency anemia presents today for headaches and memory problem.  Headaches are described as bifrontal throbbing pounding pain, sometime associated with nausea and dizziness.  She currently has 4-5 headaches days per week.  I will start her on topiramate 12.5 mg nightly with a goal of increasing up to 100 mg nightly.  Side effects of the medication discussed with the patient.  Because of the description of the headache and a BMI, I will also want to rule out idiopathic intracranial hypertension.  I will order a brain MRI without contrast.  I will contact the patient after completion of the MRI.  For her memory problem described as being forgetful, on today's exam there was no evidence of memory loss.  These are subjective complains and we will continue to observe her.  I believe once we control her headaches, and possible sleep apnea, her memory will improve.  There is also complaint of loud snoring and unrestful night, I will also refer her to sleep neurology to rule out sleep apnea.  Advised the patient to contact me if symptoms are worse  or any new concern, I will see her in 6 months for follow-up.   1. Snoring   2. Tension headache   3. Post concussive syndrome      PLAN: Start with Topiramate  1/2 tab nightly for one week   Week 2: Increase to full tab   Week 3: Increase to 2 tab nightly   Week 4: Increase to 3 tabs nightly   Week 5: Increase to 4 tabs nightly  MRI Brain without contrast to rule out idiopathic intracranial hypertension  Please call for any questions or concerns  Referral to sleep Neurology for evaluation of sleep apnea.  Return in 6 months    Orders Placed This Encounter  Procedures   MR BRAIN WO CONTRAST   Ambulatory referral to Neurology     Meds ordered this encounter  Medications   topiramate (TOPAMAX) 25 MG tablet    Sig: Take 1 tablet (25 mg total) by mouth 2 (two) times daily.    Dispense:  180 tablet    Refill:  0     Return in about 6 months (around 09/30/2021).    Windell Norfolk, MD 04/02/2021, 9:00 AM  [facility] [address], [address] [place], [place] [address] [phone]

## 2021-04-02 NOTE — Patient Instructions (Addendum)
Start with Topiramate   1/2 tab nightly for one week   Week 2: Increase to full tab   Week 3: Increase to 2 tab nightly   Week 4: Increase to 3 tabs nightly   Week 5: Increase to 4 tabs nightly  MRI Brain without contrast to rule out idiopathic intracranial hypertension  Please call for any questions or concerns  Referral to sleep Neurology for evaluation of sleep apnea.

## 2021-04-10 ENCOUNTER — Telehealth: Payer: Self-pay | Admitting: Neurology

## 2021-04-10 NOTE — Telephone Encounter (Signed)
[facility] healthy [facility] Berkley Harvey: [phone] (exp. 04/10/21 to 06/08/21) order sent to GI, they will reach out to the patient to schedule.

## 2021-04-19 ENCOUNTER — Ambulatory Visit
Admission: RE | Admit: 2021-04-19 | Discharge: 2021-04-19 | Disposition: A | Discharge status: Home / Self Care | Payer: Managed Care, Other (non HMO) | Source: Ambulatory Visit | Attending: Neurology | Admitting: Neurology

## 2021-04-19 ENCOUNTER — Other Ambulatory Visit: Payer: Self-pay

## 2021-04-19 DIAGNOSIS — F0781 Postconcussional syndrome: Secondary | ICD-10-CM

## 2021-04-19 DIAGNOSIS — R42 Dizziness and giddiness: Secondary | ICD-10-CM | POA: Diagnosis not present

## 2021-07-01 ENCOUNTER — Encounter: Payer: Self-pay | Admitting: Neurology

## 2021-07-01 ENCOUNTER — Ambulatory Visit (INDEPENDENT_AMBULATORY_CARE_PROVIDER_SITE_OTHER): Payer: Managed Care, Other (non HMO) | Admitting: Neurology

## 2021-07-01 VITALS — BP 124/79 | HR 73 | Ht 61.0 in | Wt 237.6 lb

## 2021-07-01 DIAGNOSIS — G4719 Other hypersomnia: Secondary | ICD-10-CM | POA: Diagnosis not present

## 2021-07-01 DIAGNOSIS — R0683 Snoring: Secondary | ICD-10-CM

## 2021-07-01 DIAGNOSIS — R519 Headache, unspecified: Secondary | ICD-10-CM

## 2021-07-01 DIAGNOSIS — R351 Nocturia: Secondary | ICD-10-CM | POA: Diagnosis not present

## 2021-07-01 NOTE — Patient Instructions (Signed)

## 2021-07-01 NOTE — Progress Notes (Signed)
Subjective:    Patient ID: Melissa Joyce is a 36 y.o. female.  HPI   Huston Foley, MD, PhD [facility] [address] [address] [place], [place] [address]  Dear Amalia Hailey,   I saw your patient, Melissa Joyce, upon your kind request in my sleep clinic today for initial consultation of her sleep disorder, in particular, concern for underlying obstructive sleep apnea.  The patient is unaccompanied today.  As you know, Melissa Joyce is a 36 year old right-handed woman with a medical history of sickle cell trait, allergic rhinitis, anemia, recurrent headaches, and severe obesity with a BMI of over 40, who reports snoring and excessive daytime somnolence, as well as recurrent HAs.  I reviewed your office note from 04/02/2021.  She was started on Topamax at the time.  Her Epworth sleepiness score is 9 out of 24, fatigue severity score is 47 out of 63.  She lives with her children, ages 20, 18 and 1.  She denies recurrent morning headaches but has had recurrent daytime headaches particularly in the afternoons.  She admits to not taking the Topamax regularly as prescribed, it is written for 25 mg twice daily, she currently takes 1 pill as needed.  She reports that she feels drowsy from it.  She is working on weight loss.  She has no family history of sleep apnea.  She drinks caffeine in the form of tea, typically 1 cup/day, she is a non-smoker and drinks alcohol occasionally, about once a week in the form of wine.  She goes to bed generally around 9 and rise time is around 6:30 AM.  She has a TV on in her bedroom and it tends to stay on at night until it turns off on a timer.   Her Past Medical History Is Significant For: Past Medical History:  Diagnosis Date   Allergic rhinitis    Anemia    Chorioamnionitis 09/23/2012   Postpartum care following cesarean delivery (09/22/12) 09/23/2012   Pregnant    S/P repeat low transverse C-section 09/23/2012   Sickle cell trait (HCC)     Her  Past Surgical History Is Significant For: Past Surgical History:  Procedure Laterality Date   CESAREAN SECTION  2005   CESAREAN SECTION N/A 09/22/2012   Procedure: CESAREAN SECTION;  Surgeon: Serita Kyle, MD;  Location: [facility];  Service: Obstetrics;  Laterality: N/A;   CESAREAN SECTION N/A 12/03/2019   Procedure: REPEAT CESAREAN SECTION ;  Surgeon: Shea Evans, MD;  Location: [facility];  Service: Obstetrics;  Laterality: N/A;    Her Family History Is Significant For: Family History  Problem Relation Age of Onset   Anemia Mother    Cervical cancer Mother    Heart disease Father    Asthma Sister    Asthma Brother    Cervical cancer Maternal Grandmother    Skin cancer Maternal Grandmother    Stroke Paternal Grandmother    Hypertension Paternal Grandmother    Stroke Paternal Grandfather    Hypertension Paternal Grandfather    Asthma Daughter    Ovarian cancer Other    Other Neg Hx    Sleep apnea Neg Hx     Her Social History Is Significant For: Social History   Socioeconomic History   Marital status: Media planner    Spouse name: Not on file   Number of children: Not on file   Years of education: Not on file   Highest education level: Not on file  Occupational History  Not on file  Tobacco Use   Smoking status: Never   Smokeless tobacco: Never  Vaping Use   Vaping Use: Never used  Substance and Sexual Activity   Alcohol use: Not Currently    Comment: wine on ocassion   Drug use: No   Sexual activity: Yes  Other Topics Concern   Not on file  Social History Narrative   Not on file   Social Determinants of Health   Financial Resource Strain: Not on file  Food Insecurity: Not on file  Transportation Needs: Not on file  Physical Activity: Not on file  Stress: Not on file  Social Connections: Not on file    Her Allergies Are:  Allergies  Allergen Reactions   Bee Venom Swelling   Shrimp [Shellfish Allergy] Hives    States only "steamed  shrimp". Other ways of cooking shrimp are fine.   :   Her Current Medications Are:  Outpatient Encounter Medications as of 07/01/2021  Medication Sig   fluticasone (FLONASE) 50 MCG/ACT nasal spray Place 1 spray into both nostrils daily.   iron polysaccharides (FERREX 150) 150 MG capsule Take 1 capsule (150 mg total) by mouth daily.   topiramate (TOPAMAX) 25 MG tablet Take 1 tablet (25 mg total) by mouth 2 (two) times daily.   No facility-administered encounter medications on file as of 07/01/2021.  :   Review of Systems:  Out of a complete 14 point review of systems, all are reviewed and negative with the exception of these symptoms as listed below:   Review of Systems  Neurological:        Pt is here for Sleep consult . Pt snores , headaches at times , fatigue throughout the day. Pt denies hypertension, sleep study and hypertension, CPAP at home   ESS:9 FSS:47     Objective:  Neurological Exam  Physical Exam Physical Examination:   Vitals:   07/01/21 1304  BP: 124/79  Pulse: 73    General Examination: The patient is a very pleasant 37 y.o. female in no acute distress. She appears well-developed and well-nourished and well groomed.   HEENT: Normocephalic, atraumatic, pupils are equal, round and reactive to light, extraocular tracking is good without limitation to gaze excursion or nystagmus noted. Hearing is grossly intact. Face is symmetric with normal facial animation. Speech is clear with no dysarthria noted. There is no hypophonia. There is no lip, neck/head, jaw or voice tremor. Neck is supple with full range of passive and active motion. There are no carotid bruits on auscultation. Oropharynx exam reveals: mild mouth dryness, adequate dental hygiene and mild airway crowding, due to small airway entry, tonsillar size of about 1-2+, Mallampati class I.  Tongue protrudes centrally and palate elevates symmetrically.   Chest: Clear to auscultation without wheezing, rhonchi  or crackles noted.  Heart: S1+S2+0, regular and normal without murmurs, rubs or gallops noted.   Abdomen: Soft, non-tender and non-distended.  Extremities: There is no pitting edema in the distal lower extremities bilaterally.   Skin: Warm and dry without trophic changes noted.   Musculoskeletal: exam reveals no obvious joint deformities.   Neurologically:  Mental status: The patient is awake, alert and oriented in all 4 spheres. Her immediate and remote memory, attention, language skills and fund of knowledge are appropriate. There is no evidence of aphasia, agnosia, apraxia or anomia. Speech is clear with normal prosody and enunciation. Thought process is linear. Mood is normal and affect is normal.  Cranial nerves II - XII  are as described above under HEENT exam.  Motor exam: Normal bulk, strength and tone is noted. There is no tremor. Fine motor skills and coordination: grossly intact.  Cerebellar testing: No dysmetria or intention tremor. There is no truncal or gait ataxia.  Sensory exam: intact to light touch in the upper and lower extremities.  Gait, station and balance: She stands easily. No veering to one side is noted. No leaning to one side is noted. Posture is age-appropriate and stance is narrow based. Gait shows normal stride length and normal pace. No problems turning are noted.   Assessment and Plan:  In summary, Melissa Joyce is a very pleasant 36 y.o.-year old female with a medical history of sickle cell trait, allergic rhinitis, anemia, recurrent headaches, and severe obesity with a BMI of over 40, whose history and physical exam concerning for obstructive sleep apnea (OSA). I had a long chat with the patient about my findings and the diagnosis of OSA, its prognosis and treatment options. We talked about medical treatments, surgical interventions and non-pharmacological approaches. I explained in particular the risks and ramifications of untreated moderate to severe OSA,  especially with respect to developing cardiovascular disease down the Road, including congestive heart failure, difficult to treat hypertension, cardiac arrhythmias, or stroke. Even type 2 diabetes has, in part, been linked to untreated OSA. Symptoms of untreated OSA include daytime sleepiness, memory problems, mood irritability and mood disorder such as depression and anxiety, lack of energy, as well as recurrent headaches, especially morning headaches. We talked about trying to maintain a healthy lifestyle in general, as well as the importance of weight control. We also talked about the importance of good sleep hygiene. I recommended the following at this time: sleep study.  I outlined the differences between a laboratory attended sleep study versus home sleep test. I explained the sleep test procedure to the patient and also outlined possible surgical and non-surgical treatment options of OSA, including the use of a custom-made dental device (which would require a referral to a specialist dentist or oral surgeon), upper airway surgical options, such as traditional UPPP or a novel less invasive surgical option in the form of Inspire hypoglossal nerve stimulation (which would involve a referral to an ENT surgeon). I also explained the CPAP treatment option to the patient, who indicated that she would be willing to try CPAP if the need arises. I explained the importance of being compliant with PAP treatment, not only for insurance purposes but primarily to improve Her symptoms, and for the patient's long term health benefit, including to reduce Her cardiovascular risks. I answered all her questions today and the patient was in agreement. I plan to see her back after the sleep study is completed and encouraged her to call with any interim questions, concerns, problems or updates.  She is also encouraged to get in touch with you regarding her Topamax and side effects.  Thank you very much for allowing me to  participate in the care of this nice patient. If I can be of any further assistance to you please do not hesitate to call me at [phone].  Sincerely,   Huston Foley, MD, PhD

## 2021-07-16 ENCOUNTER — Telehealth: Payer: Self-pay

## 2021-07-16 NOTE — Telephone Encounter (Signed)
LVM for pt to call me back to schedule sleep study  

## 2021-07-24 ENCOUNTER — Ambulatory Visit (INDEPENDENT_AMBULATORY_CARE_PROVIDER_SITE_OTHER): Payer: Managed Care, Other (non HMO) | Admitting: Neurology

## 2021-07-24 DIAGNOSIS — R519 Headache, unspecified: Secondary | ICD-10-CM

## 2021-07-24 DIAGNOSIS — G4719 Other hypersomnia: Secondary | ICD-10-CM

## 2021-07-24 DIAGNOSIS — G4733 Obstructive sleep apnea (adult) (pediatric): Secondary | ICD-10-CM | POA: Diagnosis not present

## 2021-07-24 DIAGNOSIS — R0683 Snoring: Secondary | ICD-10-CM

## 2021-07-24 DIAGNOSIS — R351 Nocturia: Secondary | ICD-10-CM

## 2021-07-29 NOTE — Progress Notes (Signed)
See procedure note.

## 2021-07-30 ENCOUNTER — Encounter: Payer: Self-pay | Admitting: Neurology

## 2021-07-30 NOTE — Procedures (Signed)
? ?  [facility] ? ?HOME SLEEP TEST (Watch PAT) REPORT ? ?STUDY DATE: 07/24/2021 ? ?DOB: 02-19-1986 ? ?MRN: [id number] ? ?ORDERING CLINICIAN: Star Age, MD, PhD ?  ?REFERRING CLINICIAN: Dr. April Manson ? ?CLINICAL INFORMATION/HISTORY: 36 year old right-handed woman with a medical history of sickle cell trait, allergic rhinitis, anemia, recurrent headaches, and severe obesity with a BMI of over 40, who reports snoring and excessive daytime somnolence, as well as recurrent HAs.   ? ?Epworth sleepiness score: 9/24. ? ?BMI: 45 kg/m? ? ?FINDINGS:  ? ?Sleep Summary:  ? ?Total Recording Time (hours, min): 7 hours, 35 minutes ? ?Total Sleep Time (hours, min):  6 hours, 13 minutes  ? ?Percent REM (%):    35.5%  ? ?Respiratory Indices:  ? ?Calculated pAHI (per hour):  3.1/hour        ? ?REM pAHI:    4.1/hour      ? ?NREM pAHI: 2.6/hour ? ?Oxygen Saturation Statistics:  ?  ?Oxygen Saturation (%) Mean: 97%  ? ?Minimum oxygen saturation (%):                 90%  ? ?O2 Saturation Range (%): 90-100%   ? ?O2 Saturation (minutes) <=88%: 0 min ? ?Pulse Rate Statistics:  ? ?Pulse Mean (bpm):    70/min   ? ?Pulse Range (46-118/min)  ? ?IMPRESSION: Primary snoring  ? ?RECOMMENDATION:  ?This home sleep test does not demonstrate any significant obstructive or central sleep disordered breathing. Some snoring was noted, and appeared to be intermittent, in the mild to moderate range mostly.  Weight loss and avoidance of the supine sleep position may alleviate her snoring.  Other causes of the patient's symptoms, including circadian rhythm disturbances, an underlying mood disorder, medication effect and/or an underlying medical problem cannot be ruled out based on this test. Clinical correlation is recommended. The patient should be cautioned not to drive, work at heights, or operate dangerous or heavy equipment when tired or sleepy. Review and reiteration of good sleep hygiene measures should be pursued with any patient. ?The  patient can follow up with her referring provider, who will be notified of the test results. An appointment in sleep clinic can be made as necessary.  ? ?I certify that I have reviewed the raw data recording prior to the issuance of this report in accordance with the standards of the American Academy of Sleep Medicine (AASM). ? ?INTERPRETING PHYSICIAN:  ? ?Star Age, MD, PhD  ?Board Certified in Neurology and Sleep Medicine ? ?[facility] ?[address], [address] ?[place], [place] [address] ?[phone] ? ? ? ? ? ? ? ? ? ? ? ? ? ? ? ? ? ? ?

## 2021-07-31 NOTE — Telephone Encounter (Signed)
Huston Foley, MD  ?07/30/2021  5:37 PM EDT Back to Top  ?  ?Patient referred by Dr. Teresa Coombs, seen by me on 07/01/2021, HST on 07/24/2021.   ?Please call and notify the patient that the recent home sleep test did not show any significant obstructive sleep apnea.  Intermittent snoring was detected.  Weight loss and avoiding sleeping on her back may alleviate her snoring.  She can follow-up with Dr. Teresa Coombs as scheduled/planned.  ?Thanks, ?  ?Huston Foley, MD, PhD ?[facility])  ? ?

## 2021-10-01 ENCOUNTER — Ambulatory Visit: Payer: Managed Care, Other (non HMO) | Admitting: Neurology

## 2021-10-07 ENCOUNTER — Telehealth: Payer: Self-pay | Admitting: Hematology and Oncology

## 2021-10-07 ENCOUNTER — Encounter: Payer: Self-pay | Admitting: Neurology

## 2021-10-07 ENCOUNTER — Ambulatory Visit: Payer: Managed Care, Other (non HMO) | Admitting: Neurology

## 2021-10-07 NOTE — Telephone Encounter (Signed)
Scheduled appt per 6/5 referral. Pt is aware of appt date and time. Pt is aware to arrive 15 mins prior to appt time and to bring and updated insurance card. Pt is aware of appt location.   

## 2021-10-22 ENCOUNTER — Telehealth: Payer: Self-pay | Admitting: Hematology and Oncology

## 2021-10-22 NOTE — Telephone Encounter (Signed)
Per 6/20 reminder call  called pt and left message about new pt appointment

## 2021-10-23 ENCOUNTER — Other Ambulatory Visit: Payer: Self-pay | Admitting: Pharmacy Technician

## 2021-10-23 ENCOUNTER — Other Ambulatory Visit: Payer: Self-pay

## 2021-10-23 ENCOUNTER — Inpatient Hospital Stay: Payer: Managed Care, Other (non HMO) | Attending: Hematology and Oncology | Admitting: Hematology and Oncology

## 2021-10-23 ENCOUNTER — Telehealth: Payer: Self-pay | Admitting: Hematology and Oncology

## 2021-10-23 ENCOUNTER — Inpatient Hospital Stay: Payer: Managed Care, Other (non HMO)

## 2021-10-23 ENCOUNTER — Telehealth: Payer: Self-pay | Admitting: Pharmacy Technician

## 2021-10-23 VITALS — BP 121/91 | HR 73 | Temp 98.2°F | Resp 16 | Wt 237.9 lb

## 2021-10-23 DIAGNOSIS — Z836 Family history of other diseases of the respiratory system: Secondary | ICD-10-CM | POA: Diagnosis not present

## 2021-10-23 DIAGNOSIS — Z79899 Other long term (current) drug therapy: Secondary | ICD-10-CM | POA: Diagnosis not present

## 2021-10-23 DIAGNOSIS — Z8049 Family history of malignant neoplasm of other genital organs: Secondary | ICD-10-CM | POA: Diagnosis not present

## 2021-10-23 DIAGNOSIS — Z98891 History of uterine scar from previous surgery: Secondary | ICD-10-CM | POA: Insufficient documentation

## 2021-10-23 DIAGNOSIS — R42 Dizziness and giddiness: Secondary | ICD-10-CM | POA: Diagnosis not present

## 2021-10-23 DIAGNOSIS — Z808 Family history of malignant neoplasm of other organs or systems: Secondary | ICD-10-CM | POA: Diagnosis not present

## 2021-10-23 DIAGNOSIS — R202 Paresthesia of skin: Secondary | ICD-10-CM | POA: Diagnosis not present

## 2021-10-23 DIAGNOSIS — Z793 Long term (current) use of hormonal contraceptives: Secondary | ICD-10-CM | POA: Diagnosis not present

## 2021-10-23 DIAGNOSIS — R5383 Other fatigue: Secondary | ICD-10-CM | POA: Insufficient documentation

## 2021-10-23 DIAGNOSIS — Z803 Family history of malignant neoplasm of breast: Secondary | ICD-10-CM | POA: Diagnosis not present

## 2021-10-23 DIAGNOSIS — Z91030 Bee allergy status: Secondary | ICD-10-CM | POA: Diagnosis not present

## 2021-10-23 DIAGNOSIS — R630 Anorexia: Secondary | ICD-10-CM | POA: Insufficient documentation

## 2021-10-23 DIAGNOSIS — Z823 Family history of stroke: Secondary | ICD-10-CM | POA: Diagnosis not present

## 2021-10-23 DIAGNOSIS — D500 Iron deficiency anemia secondary to blood loss (chronic): Secondary | ICD-10-CM

## 2021-10-23 DIAGNOSIS — N920 Excessive and frequent menstruation with regular cycle: Secondary | ICD-10-CM | POA: Insufficient documentation

## 2021-10-23 DIAGNOSIS — Z800 Family history of malignant neoplasm of digestive organs: Secondary | ICD-10-CM | POA: Insufficient documentation

## 2021-10-23 DIAGNOSIS — Z8249 Family history of ischemic heart disease and other diseases of the circulatory system: Secondary | ICD-10-CM | POA: Insufficient documentation

## 2021-10-23 DIAGNOSIS — D573 Sickle-cell trait: Secondary | ICD-10-CM | POA: Insufficient documentation

## 2021-10-23 LAB — CMP (CANCER CENTER ONLY)
ALT: 14 U/L (ref 0–44)
AST: 19 U/L (ref 15–41)
Albumin: 3.6 g/dL (ref 3.5–5.0)
Alkaline Phosphatase: 37 U/L — ABNORMAL LOW (ref 38–126)
Anion gap: 6 (ref 5–15)
BUN: 8 mg/dL (ref 6–20)
CO2: 25 mmol/L (ref 22–32)
Calcium: 8.9 mg/dL (ref 8.9–10.3)
Chloride: 109 mmol/L (ref 98–111)
Creatinine: 0.83 mg/dL (ref 0.44–1.00)
GFR, Estimated: >60 mL/min (ref >60)
Glucose, Bld: 86 mg/dL (ref 70–99)
Potassium: 3.9 mmol/L (ref 3.5–5.1)
Sodium: 140 mmol/L (ref 135–145)
Total Bilirubin: 0.2 mg/dL — ABNORMAL LOW (ref 0.3–1.2)
Total Protein: 7.8 g/dL (ref 6.5–8.1)

## 2021-10-23 LAB — CBC WITH DIFFERENTIAL (CANCER CENTER ONLY)
Abs Immature Granulocytes: 0.03 10*3/uL (ref 0.00–0.07)
Basophils Absolute: 0 10*3/uL (ref 0.0–0.1)
Basophils Relative: 0 %
Eosinophils Absolute: 0.2 10*3/uL (ref 0.0–0.5)
Eosinophils Relative: 2 %
HCT: 30.9 % — ABNORMAL LOW (ref 36.0–46.0)
Hemoglobin: 10.2 g/dL — ABNORMAL LOW (ref 12.0–15.0)
Immature Granulocytes: 0 %
Lymphocytes Relative: 27 %
Lymphs Abs: 2.9 10*3/uL (ref 0.7–4.0)
MCH: 21.5 pg — ABNORMAL LOW (ref 26.0–34.0)
MCHC: 33 g/dL (ref 30.0–36.0)
MCV: 65.1 fL — ABNORMAL LOW (ref 80.0–100.0)
Monocytes Absolute: 0.6 10*3/uL (ref 0.1–1.0)
Monocytes Relative: 6 %
Neutro Abs: 7.2 10*3/uL (ref 1.7–7.7)
Neutrophils Relative %: 65 %
Platelet Count: 363 10*3/uL (ref 150–400)
RBC: 4.75 MIL/uL (ref 3.87–5.11)
RDW: 16.8 % — ABNORMAL HIGH (ref 11.5–15.5)
Smear Review: NORMAL
WBC Count: 11 10*3/uL — ABNORMAL HIGH (ref 4.0–10.5)
nRBC: 0 % (ref 0.0–0.2)

## 2021-10-23 LAB — RETIC PANEL
Immature Retic Fract: 31 % — ABNORMAL HIGH (ref 2.3–15.9)
RBC.: 4.69 MIL/uL (ref 3.87–5.11)
Retic Count, Absolute: 57.7 10*3/uL (ref 19.0–186.0)
Retic Ct Pct: 1.2 % (ref 0.4–3.1)
Reticulocyte Hemoglobin: 25.5 pg — ABNORMAL LOW (ref >27.9)

## 2021-10-23 LAB — IRON AND IRON BINDING CAPACITY (CC-WL,HP ONLY)
Iron: 13 ug/dL — ABNORMAL LOW (ref 28–170)
Saturation Ratios: 2 % — ABNORMAL LOW (ref 10.4–31.8)
TIBC: 631 ug/dL — ABNORMAL HIGH (ref 250–450)
UIBC: 618 ug/dL — ABNORMAL HIGH (ref 148–442)

## 2021-10-23 LAB — FERRITIN: Ferritin: 3 ng/mL — ABNORMAL LOW (ref 11–307)

## 2021-10-23 NOTE — Telephone Encounter (Addendum)
Dr. Leonides Schanz,  Monoferric is a non preferred medication for [employer] and will be denied due to patient has not tried and or failed step therapy. Venofer Infed Ferrlecit  Would you like to try venofer?  Please advise.    Auth Submission: no auth needed Payer: [employer] Medication & CPT/J Code(s) submitted: monoferric Route of submission (phone, fax, portal): phone [phone] Auth type: Buy/Bill Units/visits requested: x5 doses Reference number: Dennison Bulla - W 10/23/21 @ 12:57 Approval from: 10/23/21 to 01/23/22

## 2021-10-23 NOTE — Telephone Encounter (Signed)
Per 6/21 los called and left message for pt about appointments  details and call back number were left

## 2021-10-23 NOTE — Progress Notes (Signed)
[facility] [phone]   [phone]  INITIAL CONSULT NOTE  Patient Care Team: Norm Salt, PA as PCP - General (Physician Assistant) Hermina Staggers, MD as Consulting Physician (Obstetrics and Gynecology)  Hematological/Oncological History # Iron Deficiency Anemia 2/2 to GYN Bleeding 12/11/2019: WBC 9.1, Hgb 11.4, MCV 84.5, Plt 296 09/17/2021: WBC 9.8, Hgb 9.8, MCV 70, Plt 442. TIBC 498, Iron sat 4% 10/23/2021: establish care with Dr. Leonides Schanz   CHIEF COMPLAINTS/PURPOSE OF CONSULTATION:  "Iron Deficiency Anemia 2/2 to GYN Bleeding "  HISTORY OF PRESENTING ILLNESS:  Melissa Joyce 36 y.o. female with medical history significant for sickle cell trait and allergic rhinitis who presents for evaluation of iron deficiency anemia.  On review of the previous records Melissa Joyce had labs drawn on 12/11/2019 which showed white blood cell count 9.1, hemoglobin 1.4, MCV 84.5, and platelets of 296.  Most recently on 09/17/2021 the patient had labs drawn which showed hemoglobin 9.8, MCV 70, and platelets of 442.  She is also noted to have a total iron binding capacity of 498, and iron saturation 4%.  Due to concern for these findings she was referred to hematology for further evaluation and management.  On exam today Melissa Joyce reports that she has been anemic since at least the age of 57.  She has been treated with iron pills before in the past but they "do not work".  She notes that she does not have any issues with stomach upset while taking the medication.  She notes she underwent a blood transfusion in 2020 while pregnant and did get an iron infusion after delivery.  She reports that her menstrual cycles are regular and occur every 28 days and last for about 6 to 7 days.  Her heaviest days are days 2 through 4 and she goes to about 4-7 soaked pads per day.  She reports that she eats an unrestricted diet though she has a poor appetite due to fatigue.  She notes that she does  eat salads and tries to eat green leafy vegetables.  She eats red meat approximately once per week.  She notes that she prefers chicken and fish.  On further discussion she notes that her family history is markable for "free bleeders".  She notes that her mom is anemic and the grandmother is anemic as well.  Her father passed away of heart disease and her uncle passed away of an unspecified blood issue.  She notes that she had an aunt with breast cancer as well as another aunt with liver cancer.  She is a never smoker and only occasionally drinks socially.  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  She has been experiencing numerous symptoms as a result of her anemia including fatigue, dizziness, tingling in her hands and feet, bladder craving, and ice craving.  She has not been having any issues with shortness of breath.  She otherwise denies any fevers, chills, sweats, nausea, vomiting or diarrhea.  A full 10 point ROS is listed below.  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   Postpartum care following cesarean delivery (09/22/12) 09/23/2012   Pregnant    S/P repeat low transverse C-section 09/23/2012   Sickle cell trait (HCC)     SURGICAL HISTORY: Past Surgical History:  Procedure Laterality Date   CESAREAN SECTION  2005   CESAREAN SECTION N/A 09/22/2012  Procedure: CESAREAN SECTION;  Surgeon: Serita Kyle, MD;  Location: [facility];  Service: Obstetrics;  Laterality: N/A;   CESAREAN SECTION N/A 12/03/2019   Procedure: REPEAT CESAREAN SECTION ;  Surgeon: Shea Evans, MD;  Location: [facility] LD ORS;  Service: Obstetrics;  Laterality: N/A;    SOCIAL HISTORY: Social History   Socioeconomic History   Marital status: Media planner    Spouse name: Not on file   Number of children: Not on file   Years of education: Not on file   Highest education level: Not on file   Occupational History   Not on file  Tobacco Use   Smoking status: Never   Smokeless tobacco: Never  Vaping Use   Vaping Use: Never used  Substance and Sexual Activity   Alcohol use: Not Currently    Comment: wine on ocassion   Drug use: No   Sexual activity: Yes  Other Topics Concern   Not on file  Social History Narrative   Not on file   Social Determinants of Health   Financial Resource Strain: Not on file  Food Insecurity: Not on file  Transportation Needs: Not on file  Physical Activity: Not on file  Stress: Not on file  Social Connections: Not on file  Intimate Partner Violence: Not on file    FAMILY HISTORY: Family History  Problem Relation Age of Onset   Anemia Mother    Cervical cancer Mother    Heart disease Father    Asthma Sister    Asthma Brother    Cervical cancer Maternal Grandmother    Skin cancer Maternal Grandmother    Stroke Paternal Grandmother    Hypertension Paternal Grandmother    Stroke Paternal Grandfather    Hypertension Paternal Grandfather    Asthma Daughter    Ovarian cancer Other    Other Neg Hx    Sleep apnea Neg Hx     ALLERGIES:  is allergic to bee venom and shrimp [shellfish allergy].  MEDICATIONS:  Current Outpatient Medications  Medication Sig Dispense Refill   etonogestrel-ethinyl estradiol (NUVARING) 0.12-0.015 MG/24HR vaginal ring NuvaRing     fluticasone (FLONASE) 50 MCG/ACT nasal spray Place 1 spray into both nostrils daily. 16 g 2   iron polysaccharides (FERREX 150) 150 MG capsule Take 1 capsule (150 mg total) by mouth daily.     phentermine 37.5 MG capsule 1 tab(s) orally once a day (in the morning) for 30 days     No current facility-administered medications for this visit.    REVIEW OF SYSTEMS:   Constitutional: ( - ) fevers, ( - )  chills , ( - ) night sweats Eyes: ( - ) blurriness of vision, ( - ) double vision, ( - ) watery eyes Ears, nose, mouth, throat, and face: ( - ) mucositis, ( - ) sore  throat Respiratory: ( - ) cough, ( - ) dyspnea, ( - ) wheezes Cardiovascular: ( - ) palpitation, ( - ) chest discomfort, ( - ) lower extremity swelling Gastrointestinal:  ( - ) nausea, ( - ) heartburn, ( - ) change in bowel habits Skin: ( - ) abnormal skin rashes Lymphatics: ( - ) new lymphadenopathy, ( - ) easy bruising Neurological: ( - ) numbness, ( - ) tingling, ( - ) new weaknesses Behavioral/Psych: ( - ) mood change, ( - ) new changes  All other systems were reviewed with the patient and are negative.  PHYSICAL EXAMINATION:  Vitals:   10/23/21 0914  BP: Marland Kitchen)  121/91  Pulse: 73  Resp: 16  Temp: 98.2 F (36.8 C)  SpO2: 99%   Filed Weights   10/23/21 0914  Weight: 237 lb 14.4 oz (107.9 kg)    GENERAL: well appearing middle-aged African-American female in NAD  SKIN: skin color, texture, turgor are normal, no rashes or significant lesions EYES: conjunctiva are pink and non-injected, sclera clear LUNGS: clear to auscultation and percussion with normal breathing effort HEART: regular rate & rhythm and no murmurs and no lower extremity edema Musculoskeletal: no cyanosis of digits and no clubbing  PSYCH: alert & oriented x 3, fluent speech NEURO: no focal motor/sensory deficits  LABORATORY DATA:  I have reviewed the data as listed    Latest Ref Rng & Units 10/23/2021   10:22 AM 12/11/2019    3:01 PM 12/06/2019   11:08 AM  CBC  WBC 4.0 - 10.5 K/uL 11.0  9.1  9.7   Hemoglobin 12.0 - 15.0 g/dL 03.4  74.2  9.0   Hematocrit 36.0 - 46.0 % 30.9  34.3  26.1   Platelets 150 - 400 K/uL 363  296  161        Latest Ref Rng & Units 10/23/2021   10:22 AM 12/11/2019    3:01 PM 12/05/2019    9:22 AM  CMP  Glucose 70 - 99 mg/dL 86  73  92   BUN 6 - 20 mg/dL 8  <5  6   Creatinine 5.95 - 1.00 mg/dL 6.38  7.56  4.33   Sodium 135 - 145 mmol/L 140  139  134   Potassium 3.5 - 5.1 mmol/L 3.9  4.2  4.2   Chloride 98 - 111 mmol/L 109  106  105   CO2 22 - 32 mmol/L 25  21  22    Calcium 8.9 - 10.3  mg/dL 8.9  8.5  8.5   Total Protein 6.5 - 8.1 g/dL 7.8  6.9  5.4   Total Bilirubin 0.3 - 1.2 mg/dL 0.2  0.4  0.9   Alkaline Phos 38 - 126 U/L 37  62  73   AST 15 - 41 U/L 19  43  30   ALT 0 - 44 U/L 14  24  26       ASSESSMENT & PLAN Melissa Joyce 36 y.o. female with medical history significant for sickle cell trait and allergic rhinitis who presents for evaluation of iron deficiency anemia.  After review of the labs, review of the records, and discussion with the patient the patients findings are most consistent with iron deficiency anemia secondary to GYN bleeding.  The patient has been taking iron pills with no significant improvement in her hemoglobin levels.  As such I would recommend we proceed with IV iron therapy.  # Iron Deficiency Anemia 2/2 to GYN Bleeding -- Findings are consistent with iron deficiency anemia secondary to patient's menorrhagia --Encouraged her to follow-up with OB/GYN for better control of her menstrual cycles --We will confirm iron deficiency anemia by ordering iron panel and ferritin as well as reticulocytes, CBC, and CMP --Continue iron polysaccharide 150 mg p.o. daily with a source of vitamin C --We will plan to proceed with IV iron therapy in order to help bolster the patient's blood counts --Plan for return to clinic in 4 to 6 weeks time after last dose of IV iron  Orders Placed This Encounter  Procedures   CBC with Differential (Cancer Center Only)    Standing Status:   Future    Number  of Occurrences:   1    Standing Expiration Date:   10/24/2022   CMP (Cancer Center only)    Standing Status:   Future    Number of Occurrences:   1    Standing Expiration Date:   10/24/2022   Ferritin    Standing Status:   Future    Number of Occurrences:   1    Standing Expiration Date:   10/24/2022   Iron and Iron Binding Capacity ([facility] only)    Standing Status:   Future    Number of Occurrences:   1    Standing Expiration Date:   10/24/2022   Retic Panel     Standing Status:   Future    Number of Occurrences:   1    Standing Expiration Date:   10/24/2022   All questions were answered. The patient knows to call the clinic with any problems, questions or concerns.  A total of more than 60 minutes were spent on this encounter with face-to-face time and non-face-to-face time, including preparing to see the patient, ordering tests and/or medications, counseling the patient and coordination of care as outlined above.   Ulysees Barns, MD Department of Hematology/Oncology [facility] Phone: [phone] Pager: [phone] Email: [email or web address]  10/23/2021 11:23 AM

## 2021-10-24 ENCOUNTER — Ambulatory Visit (INDEPENDENT_AMBULATORY_CARE_PROVIDER_SITE_OTHER): Payer: Managed Care, Other (non HMO)

## 2021-10-24 VITALS — BP 134/84 | HR 60 | Temp 98.3°F | Resp 15 | Ht 61.0 in | Wt 238.6 lb

## 2021-10-24 DIAGNOSIS — D500 Iron deficiency anemia secondary to blood loss (chronic): Secondary | ICD-10-CM

## 2021-10-24 MED ORDER — SODIUM CHLORIDE 0.9 % IV SOLN
200.0000 mg | Freq: Once | INTRAVENOUS | Status: AC
  Administered 2021-10-24: 200 mg via INTRAVENOUS
  Filled 2021-10-24: qty 10

## 2021-10-24 NOTE — Progress Notes (Signed)
Diagnosis: Iron Deficiency Anemia  Provider:  Chilton Greathouse, MD  Procedure: Infusion  IV Type: Peripheral, IV Location: R Antecubital  Venofer (Iron Sucrose), Dose: 200 mg  Infusion Start Time: 0928  Infusion Stop Time: 0946  Post Infusion IV Care: Observation period completed, peripheral IV discontinued  Discharge: Condition: Good, Destination: Home . AVS provided to patient.   Performed by:  Sandie Ano, RN

## 2021-10-31 ENCOUNTER — Ambulatory Visit (INDEPENDENT_AMBULATORY_CARE_PROVIDER_SITE_OTHER): Payer: Managed Care, Other (non HMO)

## 2021-10-31 VITALS — BP 130/82 | HR 63 | Temp 98.1°F | Resp 18 | Wt 239.0 lb

## 2021-10-31 DIAGNOSIS — D500 Iron deficiency anemia secondary to blood loss (chronic): Secondary | ICD-10-CM

## 2021-10-31 MED ORDER — SODIUM CHLORIDE 0.9 % IV SOLN
200.0000 mg | Freq: Once | INTRAVENOUS | Status: AC
  Administered 2021-10-31: 200 mg via INTRAVENOUS
  Filled 2021-10-31: qty 10

## 2021-10-31 NOTE — Progress Notes (Signed)
Diagnosis: Iron Deficiency Anemia  Provider:  Chilton Greathouse, MD  Procedure: Infusion  IV Type: Peripheral, IV Location: R Antecubital  Venofer (Iron Sucrose), Dose: 200 mg  Infusion Start Time: 0934  Infusion Stop Time: 0955  Post Infusion IV Care: Peripheral IV Discontinued  Discharge: Condition: Good, Destination: Home . AVS provided to patient.   Performed by:  Loney Hering, LPN

## 2021-11-07 ENCOUNTER — Ambulatory Visit (INDEPENDENT_AMBULATORY_CARE_PROVIDER_SITE_OTHER): Payer: Managed Care, Other (non HMO)

## 2021-11-07 ENCOUNTER — Other Ambulatory Visit: Payer: Self-pay | Admitting: Neurology

## 2021-11-07 VITALS — BP 130/80 | HR 80 | Temp 98.9°F | Resp 16 | Ht 61.0 in | Wt 238.0 lb

## 2021-11-07 DIAGNOSIS — D500 Iron deficiency anemia secondary to blood loss (chronic): Secondary | ICD-10-CM | POA: Diagnosis not present

## 2021-11-07 MED ORDER — SODIUM CHLORIDE 0.9 % IV SOLN
200.0000 mg | Freq: Once | INTRAVENOUS | Status: AC
  Administered 2021-11-07: 200 mg via INTRAVENOUS
  Filled 2021-11-07: qty 10

## 2021-11-07 MED ORDER — DIPHENHYDRAMINE HCL 25 MG PO CAPS
25.0000 mg | ORAL_CAPSULE | Freq: Once | ORAL | Status: AC
  Administered 2021-11-07: 25 mg via ORAL
  Filled 2021-11-07: qty 1

## 2021-11-07 NOTE — Progress Notes (Signed)
Diagnosis: Iron Deficiency Anemia  Provider:  Chilton Greathouse, MD  Procedure: Infusion  IV Type: Peripheral, IV Location: R Antecubital  Venofer (Iron Sucrose), Dose: 200 mg  Infusion Start Time: 1534  Infusion Stop Time: 1601  Post Infusion IV Care: Peripheral IV Discontinued  Discharge: Condition: Good, Patient given 25mg  PO benadryl today for c/o itching after last infusion. Destination: Home . AVS provided to patient.   Performed by:  , RN

## 2021-11-14 ENCOUNTER — Ambulatory Visit: Payer: Managed Care, Other (non HMO)

## 2021-11-14 VITALS — BP 117/81 | HR 74 | Temp 98.6°F | Resp 16 | Ht 61.0 in | Wt 237.6 lb

## 2021-11-14 DIAGNOSIS — D500 Iron deficiency anemia secondary to blood loss (chronic): Secondary | ICD-10-CM | POA: Diagnosis not present

## 2021-11-14 MED ORDER — DIPHENHYDRAMINE HCL 25 MG PO CAPS: 25.0000 mg | ORAL_CAPSULE | Freq: Once | ORAL | Status: DC

## 2021-11-14 MED ORDER — SODIUM CHLORIDE 0.9 % IV SOLN
200.0000 mg | Freq: Once | INTRAVENOUS | Status: AC
  Administered 2021-11-14: 200 mg via INTRAVENOUS
  Filled 2021-11-14: qty 10

## 2021-11-14 NOTE — Progress Notes (Signed)
Diagnosis: Iron Deficiency Anemia  Provider:  Chilton Greathouse, MD  Procedure: Infusion  IV Type: Peripheral, IV Location: R Antecubital  Venofer (Iron Sucrose), Dose: 200 mg  Infusion Start Time: 1538  Infusion Stop Time: 1554  Post Infusion IV Care: Peripheral IV Discontinued  Discharge: Condition: Good, Destination: Home . AVS provided to patient.   Performed by:  Nat Math, RN

## 2021-11-21 ENCOUNTER — Telehealth: Payer: Self-pay | Admitting: *Deleted

## 2021-11-21 ENCOUNTER — Ambulatory Visit: Payer: Managed Care, Other (non HMO)

## 2021-11-21 VITALS — BP 123/74 | HR 64 | Temp 98.6°F | Resp 18 | Ht 61.0 in | Wt 237.4 lb

## 2021-11-21 DIAGNOSIS — D500 Iron deficiency anemia secondary to blood loss (chronic): Secondary | ICD-10-CM | POA: Diagnosis not present

## 2021-11-21 MED ORDER — DIPHENHYDRAMINE HCL 25 MG PO CAPS: 25.0000 mg | ORAL_CAPSULE | Freq: Once | ORAL | Status: DC

## 2021-11-21 MED ORDER — SODIUM CHLORIDE 0.9 % IV SOLN
200.0000 mg | Freq: Once | INTRAVENOUS | Status: AC
  Administered 2021-11-21: 200 mg via INTRAVENOUS
  Filled 2021-11-21: qty 10

## 2021-11-21 NOTE — Telephone Encounter (Signed)
Connected with Altheria Shadoan [phone] (home) regarding [employer] leave of absence form.  Advised of the required [facility] Authorization to process paperwork.  E-mailed ROI and [employer] to [email or web address].

## 2021-11-21 NOTE — Progress Notes (Signed)
Diagnosis: Iron Deficiency Anemia  Provider:  Chilton Greathouse, MD  Procedure: Infusion  IV Type: Peripheral, IV Location: R Antecubital  Venofer (Iron Sucrose), Dose: 200 mg  Infusion Start Time: 1520  Infusion Stop Time: 1537  Post Infusion IV Care: Peripheral IV Discontinued  Discharge: Condition: Good, Destination: Home . AVS provided to patient.   Performed by:  Loney Hering, LPN

## 2021-11-26 NOTE — Telephone Encounter (Signed)
Message left for Melissa Joyce [phone] (home) regarding status of the [facility] Authorization for Use/Disclosure not yet received.  Form completed by this nurse 11/21/2021 to provider for review and signature.  Received by today, 11/26/2021 awaiting ROI to return to [employer].

## 2021-11-28 NOTE — Telephone Encounter (Addendum)
Connected with Tyeshia Cornforth [phone] (home) who checked e-mail during call, located e-mail and completed ROI received via [email or web address] e-mail.   Successfully returned [employer] leave request.  Copy to bin for items to be scanned to EMR.  Original copy to alphabetical file folder near registrar number one for patient pick up as requested per patient at this time.  No further questions or needs.  Process completed at this time.

## 2021-12-13 ENCOUNTER — Inpatient Hospital Stay: Payer: Managed Care, Other (non HMO) | Attending: Hematology and Oncology

## 2021-12-13 ENCOUNTER — Other Ambulatory Visit: Payer: Self-pay | Admitting: Hematology and Oncology

## 2021-12-13 ENCOUNTER — Other Ambulatory Visit: Payer: Self-pay

## 2021-12-13 ENCOUNTER — Inpatient Hospital Stay (HOSPITAL_BASED_OUTPATIENT_CLINIC_OR_DEPARTMENT_OTHER): Payer: Managed Care, Other (non HMO) | Admitting: Hematology and Oncology

## 2021-12-13 VITALS — BP 115/83 | HR 63 | Temp 97.7°F | Resp 16 | Wt 235.5 lb

## 2021-12-13 DIAGNOSIS — Z79899 Other long term (current) drug therapy: Secondary | ICD-10-CM | POA: Insufficient documentation

## 2021-12-13 DIAGNOSIS — Z832 Family history of diseases of the blood and blood-forming organs and certain disorders involving the immune mechanism: Secondary | ICD-10-CM | POA: Insufficient documentation

## 2021-12-13 DIAGNOSIS — Z8049 Family history of malignant neoplasm of other genital organs: Secondary | ICD-10-CM | POA: Insufficient documentation

## 2021-12-13 DIAGNOSIS — D500 Iron deficiency anemia secondary to blood loss (chronic): Secondary | ICD-10-CM | POA: Diagnosis present

## 2021-12-13 DIAGNOSIS — Z8041 Family history of malignant neoplasm of ovary: Secondary | ICD-10-CM | POA: Diagnosis not present

## 2021-12-13 DIAGNOSIS — Z808 Family history of malignant neoplasm of other organs or systems: Secondary | ICD-10-CM | POA: Insufficient documentation

## 2021-12-13 DIAGNOSIS — Z793 Long term (current) use of hormonal contraceptives: Secondary | ICD-10-CM | POA: Insufficient documentation

## 2021-12-13 DIAGNOSIS — Z8249 Family history of ischemic heart disease and other diseases of the circulatory system: Secondary | ICD-10-CM | POA: Insufficient documentation

## 2021-12-13 DIAGNOSIS — N920 Excessive and frequent menstruation with regular cycle: Secondary | ICD-10-CM | POA: Insufficient documentation

## 2021-12-13 DIAGNOSIS — Z91030 Bee allergy status: Secondary | ICD-10-CM | POA: Diagnosis not present

## 2021-12-13 DIAGNOSIS — Z823 Family history of stroke: Secondary | ICD-10-CM | POA: Insufficient documentation

## 2021-12-13 DIAGNOSIS — Z836 Family history of other diseases of the respiratory system: Secondary | ICD-10-CM | POA: Insufficient documentation

## 2021-12-13 DIAGNOSIS — Z98891 History of uterine scar from previous surgery: Secondary | ICD-10-CM | POA: Diagnosis not present

## 2021-12-13 LAB — CBC WITH DIFFERENTIAL (CANCER CENTER ONLY)
Abs Immature Granulocytes: 0.02 10*3/uL (ref 0.00–0.07)
Basophils Absolute: 0.1 10*3/uL (ref 0.0–0.1)
Basophils Relative: 1 %
Eosinophils Absolute: 0.1 10*3/uL (ref 0.0–0.5)
Eosinophils Relative: 1 %
HCT: 35.2 % — ABNORMAL LOW (ref 36.0–46.0)
Hemoglobin: 12.3 g/dL (ref 12.0–15.0)
Immature Granulocytes: 0 %
Lymphocytes Relative: 38 %
Lymphs Abs: 3.2 10*3/uL (ref 0.7–4.0)
MCH: 25 pg — ABNORMAL LOW (ref 26.0–34.0)
MCHC: 34.9 g/dL (ref 30.0–36.0)
MCV: 71.5 fL — ABNORMAL LOW (ref 80.0–100.0)
Monocytes Absolute: 0.5 10*3/uL (ref 0.1–1.0)
Monocytes Relative: 6 %
Neutro Abs: 4.6 10*3/uL (ref 1.7–7.7)
Neutrophils Relative %: 54 %
Platelet Count: 271 10*3/uL (ref 150–400)
RBC: 4.92 MIL/uL (ref 3.87–5.11)
RDW: 22.8 % — ABNORMAL HIGH (ref 11.5–15.5)
WBC Count: 8.6 10*3/uL (ref 4.0–10.5)
nRBC: 0 % (ref 0.0–0.2)

## 2021-12-13 LAB — CMP (CANCER CENTER ONLY)
ALT: 15 U/L (ref 0–44)
AST: 16 U/L (ref 15–41)
Albumin: 3.9 g/dL (ref 3.5–5.0)
Alkaline Phosphatase: 42 U/L (ref 38–126)
Anion gap: 5 (ref 5–15)
BUN: 15 mg/dL (ref 6–20)
CO2: 24 mmol/L (ref 22–32)
Calcium: 8.7 mg/dL — ABNORMAL LOW (ref 8.9–10.3)
Chloride: 111 mmol/L (ref 98–111)
Creatinine: 0.91 mg/dL (ref 0.44–1.00)
GFR, Estimated: >60 mL/min (ref >60)
Glucose, Bld: 84 mg/dL (ref 70–99)
Potassium: 4.3 mmol/L (ref 3.5–5.1)
Sodium: 140 mmol/L (ref 135–145)
Total Bilirubin: 0.2 mg/dL — ABNORMAL LOW (ref 0.3–1.2)
Total Protein: 7.5 g/dL (ref 6.5–8.1)

## 2021-12-13 LAB — IRON AND IRON BINDING CAPACITY (CC-WL,HP ONLY)
Iron: 46 ug/dL (ref 28–170)
Saturation Ratios: 10 % — ABNORMAL LOW (ref 10.4–31.8)
TIBC: 458 ug/dL — ABNORMAL HIGH (ref 250–450)
UIBC: 412 ug/dL (ref 148–442)

## 2021-12-13 LAB — FERRITIN: Ferritin: 66 ng/mL (ref 11–307)

## 2021-12-13 LAB — RETIC PANEL
Immature Retic Fract: 15.6 % (ref 2.3–15.9)
RBC.: 4.92 MIL/uL (ref 3.87–5.11)
Retic Count, Absolute: 66.9 10*3/uL (ref 19.0–186.0)
Retic Ct Pct: 1.4 % (ref 0.4–3.1)
Reticulocyte Hemoglobin: 29.6 pg (ref >27.9)

## 2021-12-13 NOTE — Progress Notes (Signed)
[facility] Telephone:(336) [phone]   Fax:(336) [phone]  PROGRESS NOTE  Patient Care Team: Norm Salt, PA as PCP - General (Physician Assistant) Hermina Staggers, MD as Consulting Physician (Obstetrics and Gynecology)  Hematological/Oncological History # Iron Deficiency Anemia 2/2 to GYN Bleeding 12/11/2019: WBC 9.1, Hgb 11.4, MCV 84.5, Plt 296 09/17/2021: WBC 9.8, Hgb 9.8, MCV 70, Plt 442. TIBC 498, Iron sat 4% 10/23/2021: establish care with Dr. Leonides Schanz  6/22-7/20/2023: IV iron sucrose 200 mg x 5 doses 12/13/2021: white blood cell count 8.6, hemoglobin 12.3, MCV 71.5, and platelets of 271.  Iron studies pending.  Interval History:  Melissa Joyce 36 y.o. female with medical history significant for iron deficiency anemia 2/2 to GYN bleeding who presents for a follow up visit. The patient's last visit was on 10/23/2021. In the interim since the last visit she received 5 doses of IV iron sucrose.  On exam today Melissa Joyce reports that she did have some difficulty with her iron infusions.  She reports that she had itching and that the first dose was the worst.  She received Benadryl with subsequent doses and had a mild reaction.  She did not have any rash just an itching sensation in her skin.  She reports that she did have a boost in her energy approximate 1 week after receiving her first dose of IV iron.  She notes that she "which she received another 2".  She notes she is not as fatigued as usual and her energy went to about 6 or 7 out of 10.  She reports her menstrual cycles have been normal not particularly heavy or light since her last visit.  She is doing her best to eat more salads and eats approximately 3/day.  She is also drinking protein shakes.  She is lost 2 pounds in the interim since her last visit.  She continues to take iron pills and has not had any stomach issues with this.  She rarely has constipation and takes iron pills with orange juice which she believes helps  with this.  She is not having any fevers, chills, sweats, nausea, or diarrhea.  Full 10 point ROS is listed below.  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   Postpartum care following cesarean delivery (09/22/12) 09/23/2012   Pregnant    S/P repeat low transverse C-section 09/23/2012   Sickle cell trait (HCC)     SURGICAL HISTORY: Past Surgical History:  Procedure Laterality Date   CESAREAN SECTION  2005   CESAREAN SECTION N/A 09/22/2012   Procedure: CESAREAN SECTION;  Surgeon: Serita Kyle, MD;  Location: [facility];  Service: Obstetrics;  Laterality: N/A;   CESAREAN SECTION N/A 12/03/2019   Procedure: REPEAT CESAREAN SECTION ;  Surgeon: Shea Evans, MD;  Location: [facility];  Service: Obstetrics;  Laterality: N/A;    SOCIAL HISTORY: Social History   Socioeconomic History   Marital status: Single    Spouse name: Not on file   Number of children: Not on file   Years of education: Not on file   Highest education level: Not on file  Occupational History   Not on file  Tobacco Use   Smoking status: Never   Smokeless tobacco: Never  Vaping Use   Vaping Use: Never used  Substance and Sexual Activity   Alcohol use: Not Currently    Comment: wine on ocassion   Drug use:  No   Sexual activity: Yes  Other Topics Concern   Not on file  Social History Narrative   Not on file   Social Determinants of Health   Financial Resource Strain: Not on file  Food Insecurity: Not on file  Transportation Needs: Not on file  Physical Activity: Not on file  Stress: Not on file  Social Connections: Not on file  Intimate Partner Violence: Not on file    FAMILY HISTORY: Family History  Problem Relation Age of Onset   Anemia Mother    Cervical cancer Mother    Heart disease Father    Asthma Sister    Asthma Brother    Cervical cancer Maternal Grandmother    Skin cancer Maternal Grandmother    Stroke  Paternal Grandmother    Hypertension Paternal Grandmother    Stroke Paternal Grandfather    Hypertension Paternal Grandfather    Asthma Daughter    Ovarian cancer Other    Other Neg Hx    Sleep apnea Neg Hx     ALLERGIES:  is allergic to bee venom and shrimp [shellfish allergy].  MEDICATIONS:  Current Outpatient Medications  Medication Sig Dispense Refill   etonogestrel-ethinyl estradiol (NUVARING) 0.12-0.015 MG/24HR vaginal ring NuvaRing     fluticasone (FLONASE) 50 MCG/ACT nasal spray Place 1 spray into both nostrils daily. 16 g 2   iron polysaccharides (FERREX 150) 150 MG capsule Take 1 capsule (150 mg total) by mouth daily.     phentermine 37.5 MG capsule 1 tab(s) orally once a day (in the morning) for 30 days     No current facility-administered medications for this visit.    REVIEW OF SYSTEMS:   Constitutional: ( - ) fevers, ( - )  chills , ( - ) night sweats Eyes: ( - ) blurriness of vision, ( - ) double vision, ( - ) watery eyes Ears, nose, mouth, throat, and face: ( - ) mucositis, ( - ) sore throat Respiratory: ( - ) cough, ( - ) dyspnea, ( - ) wheezes Cardiovascular: ( - ) palpitation, ( - ) chest discomfort, ( - ) lower extremity swelling Gastrointestinal:  ( - ) nausea, ( - ) heartburn, ( - ) change in bowel habits Skin: ( - ) abnormal skin rashes Lymphatics: ( - ) new lymphadenopathy, ( - ) easy bruising Neurological: ( - ) numbness, ( - ) tingling, ( - ) new weaknesses Behavioral/Psych: ( - ) mood change, ( - ) new changes  All other systems were reviewed with the patient and are negative.  PHYSICAL EXAMINATION:  Vitals:   12/13/21 0839  BP: 115/83  Pulse: 63  Resp: 16  Temp: 97.7 F (36.5 C)  SpO2: 100%   Filed Weights   12/13/21 0839  Weight: 235 lb 8 oz (106.8 kg)    GENERAL: Well-appearing middle-aged African-American female, alert, no distress and comfortable SKIN: skin color, texture, turgor are normal, no rashes or significant lesions EYES:  conjunctiva are pink and non-injected, sclera clear LUNGS: clear to auscultation and percussion with normal breathing effort HEART: regular rate & rhythm and no murmurs and no lower extremity edema Musculoskeletal: no cyanosis of digits and no clubbing  PSYCH: alert & oriented x 3, fluent speech NEURO: no focal motor/sensory deficits  LABORATORY DATA:  I have reviewed the data as listed    Latest Ref Rng & Units 12/13/2021    8:09 AM 10/23/2021   10:22 AM 12/11/2019    3:01 PM  CBC  WBC  4.0 - 10.5 K/uL 8.6  11.0  9.1   Hemoglobin 12.0 - 15.0 g/dL 16.0  10.9  32.3   Hematocrit 36.0 - 46.0 % 35.2  30.9  34.3   Platelets 150 - 400 K/uL 271  363  296        Latest Ref Rng & Units 12/13/2021    8:09 AM 10/23/2021   10:22 AM 12/11/2019    3:01 PM  CMP  Glucose 70 - 99 mg/dL 84  86  73   BUN 6 - 20 mg/dL 15  8  <5   Creatinine 0.44 - 1.00 mg/dL 5.57  3.22  0.25   Sodium 135 - 145 mmol/L 140  140  139   Potassium 3.5 - 5.1 mmol/L 4.3  3.9  4.2   Chloride 98 - 111 mmol/L 111  109  106   CO2 22 - 32 mmol/L 24  25  21    Calcium 8.9 - 10.3 mg/dL 8.7  8.9  8.5   Total Protein 6.5 - 8.1 g/dL 7.5  7.8  6.9   Total Bilirubin 0.3 - 1.2 mg/dL 0.2  0.2  0.4   Alkaline Phos 38 - 126 U/L 42  37  62   AST 15 - 41 U/L 16  19  43   ALT 0 - 44 U/L 15  14  24      RADIOGRAPHIC STUDIES: No results found.  ASSESSMENT & PLAN Melissa Joyce 36 y.o. female with medical history significant for iron deficiency anemia 2/2 to GYN bleeding who presents for a follow up visit.  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 daily with a source of vitamin C -- If iron levels remain low we will plan to proceed with  IV iron therapy in order to help bolster the patient's blood counts --labs today show white blood cell count 8.6, hemoglobin 12.3, MCV 71.5, and platelets of 271.  Iron studies pending. --Plan for return to clinic in 3 months time or sooner if IV iron therapy is required.  No orders of the defined types were placed in this encounter.   All questions were answered. The patient knows to call the clinic with any problems, questions or concerns.  A total of more than 25 minutes were spent on this encounter with face-to-face time and non-face-to-face time, including preparing to see the patient, ordering tests and/or medications, counseling the patient and coordination of care as outlined above.   Kem Boroughs, MD Department of Hematology/Oncology [facility] Phone: [phone] Pager: [phone] Email: [phone]  12/13/2021 9:11 AM

## 2021-12-23 ENCOUNTER — Encounter: Payer: Self-pay | Admitting: Hematology and Oncology

## 2022-06-09 ENCOUNTER — Encounter: Payer: Self-pay | Admitting: Hematology and Oncology

## 2022-06-15 ENCOUNTER — Other Ambulatory Visit: Payer: Self-pay | Admitting: Hematology and Oncology

## 2022-06-15 DIAGNOSIS — D500 Iron deficiency anemia secondary to blood loss (chronic): Secondary | ICD-10-CM

## 2022-06-16 ENCOUNTER — Encounter: Payer: Self-pay | Admitting: Hematology and Oncology

## 2022-06-16 ENCOUNTER — Other Ambulatory Visit: Payer: Self-pay

## 2022-06-16 ENCOUNTER — Inpatient Hospital Stay: Payer: Managed Care, Other (non HMO) | Attending: Hematology and Oncology

## 2022-06-16 ENCOUNTER — Inpatient Hospital Stay (HOSPITAL_BASED_OUTPATIENT_CLINIC_OR_DEPARTMENT_OTHER): Payer: Managed Care, Other (non HMO) | Admitting: Hematology and Oncology

## 2022-06-16 VITALS — BP 128/81 | HR 70 | Temp 98.5°F | Resp 13 | Wt 236.5 lb

## 2022-06-16 DIAGNOSIS — Z8049 Family history of malignant neoplasm of other genital organs: Secondary | ICD-10-CM | POA: Diagnosis not present

## 2022-06-16 DIAGNOSIS — Z91030 Bee allergy status: Secondary | ICD-10-CM | POA: Insufficient documentation

## 2022-06-16 DIAGNOSIS — Z808 Family history of malignant neoplasm of other organs or systems: Secondary | ICD-10-CM | POA: Diagnosis not present

## 2022-06-16 DIAGNOSIS — Z825 Family history of asthma and other chronic lower respiratory diseases: Secondary | ICD-10-CM | POA: Insufficient documentation

## 2022-06-16 DIAGNOSIS — D500 Iron deficiency anemia secondary to blood loss (chronic): Secondary | ICD-10-CM | POA: Diagnosis not present

## 2022-06-16 DIAGNOSIS — K5900 Constipation, unspecified: Secondary | ICD-10-CM | POA: Diagnosis not present

## 2022-06-16 DIAGNOSIS — Z823 Family history of stroke: Secondary | ICD-10-CM | POA: Insufficient documentation

## 2022-06-16 DIAGNOSIS — Z8249 Family history of ischemic heart disease and other diseases of the circulatory system: Secondary | ICD-10-CM | POA: Diagnosis not present

## 2022-06-16 DIAGNOSIS — Z98891 History of uterine scar from previous surgery: Secondary | ICD-10-CM | POA: Diagnosis not present

## 2022-06-16 DIAGNOSIS — Z793 Long term (current) use of hormonal contraceptives: Secondary | ICD-10-CM | POA: Insufficient documentation

## 2022-06-16 DIAGNOSIS — Z832 Family history of diseases of the blood and blood-forming organs and certain disorders involving the immune mechanism: Secondary | ICD-10-CM | POA: Insufficient documentation

## 2022-06-16 DIAGNOSIS — Z79899 Other long term (current) drug therapy: Secondary | ICD-10-CM | POA: Diagnosis not present

## 2022-06-16 DIAGNOSIS — N920 Excessive and frequent menstruation with regular cycle: Secondary | ICD-10-CM | POA: Insufficient documentation

## 2022-06-16 DIAGNOSIS — Z8041 Family history of malignant neoplasm of ovary: Secondary | ICD-10-CM | POA: Insufficient documentation

## 2022-06-16 LAB — CMP (CANCER CENTER ONLY)
ALT: 14 U/L (ref 0–44)
AST: 23 U/L (ref 15–41)
Albumin: 3.5 g/dL (ref 3.5–5.0)
Alkaline Phosphatase: 36 U/L — ABNORMAL LOW (ref 38–126)
Anion gap: 5 (ref 5–15)
BUN: 12 mg/dL (ref 6–20)
CO2: 23 mmol/L (ref 22–32)
Calcium: 8.6 mg/dL — ABNORMAL LOW (ref 8.9–10.3)
Chloride: 109 mmol/L (ref 98–111)
Creatinine: 0.82 mg/dL (ref 0.44–1.00)
GFR, Estimated: >60 mL/min (ref >60)
Glucose, Bld: 84 mg/dL (ref 70–99)
Potassium: 4.2 mmol/L (ref 3.5–5.1)
Sodium: 137 mmol/L (ref 135–145)
Total Bilirubin: 0.2 mg/dL — ABNORMAL LOW (ref 0.3–1.2)
Total Protein: 7.1 g/dL (ref 6.5–8.1)

## 2022-06-16 LAB — RETIC PANEL
Immature Retic Fract: 29.2 % — ABNORMAL HIGH (ref 2.3–15.9)
RBC.: 4.24 MIL/uL (ref 3.87–5.11)
Retic Count, Absolute: 60.2 10*3/uL (ref 19.0–186.0)
Retic Ct Pct: 1.4 % (ref 0.4–3.1)
Reticulocyte Hemoglobin: 23.2 pg — ABNORMAL LOW (ref >27.9)

## 2022-06-16 LAB — CBC WITH DIFFERENTIAL (CANCER CENTER ONLY)
Abs Immature Granulocytes: 0.03 10*3/uL (ref 0.00–0.07)
Basophils Absolute: 0 10*3/uL (ref 0.0–0.1)
Basophils Relative: 0 %
Eosinophils Absolute: 0.2 10*3/uL (ref 0.0–0.5)
Eosinophils Relative: 2 %
HCT: 28.7 % — ABNORMAL LOW (ref 36.0–46.0)
Hemoglobin: 9.7 g/dL — ABNORMAL LOW (ref 12.0–15.0)
Immature Granulocytes: 0 %
Lymphocytes Relative: 31 %
Lymphs Abs: 2.8 10*3/uL (ref 0.7–4.0)
MCH: 22.7 pg — ABNORMAL LOW (ref 26.0–34.0)
MCHC: 33.8 g/dL (ref 30.0–36.0)
MCV: 67.1 fL — ABNORMAL LOW (ref 80.0–100.0)
Monocytes Absolute: 0.5 10*3/uL (ref 0.1–1.0)
Monocytes Relative: 5 %
Neutro Abs: 5.6 10*3/uL (ref 1.7–7.7)
Neutrophils Relative %: 62 %
Platelet Count: 359 10*3/uL (ref 150–400)
RBC: 4.28 MIL/uL (ref 3.87–5.11)
RDW: 16.2 % — ABNORMAL HIGH (ref 11.5–15.5)
WBC Count: 9.1 10*3/uL (ref 4.0–10.5)
nRBC: 0 % (ref 0.0–0.2)

## 2022-06-16 LAB — IRON AND IRON BINDING CAPACITY (CC-WL,HP ONLY)
Iron: 14 ug/dL — ABNORMAL LOW (ref 28–170)
Saturation Ratios: 3 % — ABNORMAL LOW (ref 10.4–31.8)
TIBC: 559 ug/dL — ABNORMAL HIGH (ref 250–450)
UIBC: 545 ug/dL — ABNORMAL HIGH (ref 148–442)

## 2022-06-16 LAB — FERRITIN: Ferritin: 3 ng/mL — ABNORMAL LOW (ref 11–307)

## 2022-06-16 NOTE — Progress Notes (Signed)
[place] [phone]   Fax:(336) [phone]  PROGRESS NOTE  Patient Care Team: Trey Sailors, PA as PCP - General (Physician Assistant) Chancy Milroy, MD as Consulting Physician (Obstetrics and Gynecology)  Hematological/Oncological History # Iron Deficiency Anemia 2/2 to GYN Bleeding 12/11/2019: WBC 9.1, Hgb 11.4, MCV 84.5, Plt 296 09/17/2021: WBC 9.8, Hgb 9.8, MCV 70, Plt 442. TIBC 498, Iron sat 4% 10/23/2021: establish care with Dr. Lorenso Courier  6/22-7/20/2023: IV iron sucrose 200 mg x 5 doses 12/13/2021: white blood cell count 8.6, hemoglobin 12.3, MCV 71.5, and platelets of 271.   06/16/2022:  white blood cell count 9.1, hemoglobin 9.7, MCV 67.1, and platelets of 359.  Interval History:  Melissa Joyce 37 y.o. female with medical history significant for iron deficiency anemia 2/2 to GYN bleeding who presents for a follow up visit. The patient's last visit was on 12/13/2021. In the interim since the last visit she has had no major changes in her health and has continued to take her p.o. iron therapy.  On exam today Melissa Joyce reports she reports that she has had no major changes in her health but she feels like her energy levels are dropping down to where they were at our first visit.  She reports that the iron does occasionally cause some constipation but she is taking with orange juice which is improving it some.  She is not having any abnormal bleeding but continues to have her usual menstrual cycles.  She reports that they are not heavier than usual.  She does continue to take birth control pill and reports that she is has an upcoming appointment with her PCP where she could discuss changes in her birth control.  She reports that she is eating red meat and green leafy vegetables.  Otherwise she denies any major changes in her health.  She is not having any fevers, chills, sweats, nausea, or diarrhea.  Full 10 point ROS is listed below.  MEDICAL HISTORY:  Past  Medical History:  Diagnosis Date   Allergic rhinitis    Anemia    Chorioamnionitis 09/23/2012   Postpartum care following cesarean delivery (09/22/12) 09/23/2012   Pregnant    S/P repeat low transverse C-section 09/23/2012   Sickle cell trait ([place])     SURGICAL HISTORY: Past Surgical History:  Procedure Laterality Date   CESAREAN SECTION  2005   CESAREAN SECTION N/A 09/22/2012   Procedure: CESAREAN SECTION;  Surgeon: Marvene Staff, MD;  Location: [place] [facility];  Service: Obstetrics;  Laterality: N/A;   CESAREAN SECTION N/A 12/03/2019   Procedure: REPEAT CESAREAN SECTION ;  Surgeon: Azucena Fallen, MD;  Location: [place] [facility];  Service: Obstetrics;  Laterality: N/A;    SOCIAL HISTORY: Social History   Socioeconomic History   Marital status: Single    Spouse name: Not on file   Number of children: Not on file   Years of education: Not on file   Highest education level: Not on file  Occupational History   Not on file  Tobacco Use   Smoking status: Never   Smokeless tobacco: Never  Vaping Use   Vaping Use: Never used  Substance and Sexual Activity   Alcohol use: Not Currently    Comment: wine on ocassion   Drug use: No   Sexual activity: Yes  Other Topics Concern   Not on file  Social History Narrative   Not on file   Social Determinants of Health   Financial Resource Strain: Not on  file  Food Insecurity: Not on file  Transportation Needs: Not on file  Physical Activity: Not on file  Stress: Not on file  Social Connections: Not on file  Intimate Partner Violence: Not on file    FAMILY HISTORY: Family History  Problem Relation Age of Onset   Anemia Mother    Cervical cancer Mother    Heart disease Father    Asthma Sister    Asthma Brother    Cervical cancer Maternal Grandmother    Skin cancer Maternal Grandmother    Stroke Paternal Grandmother    Hypertension Paternal Grandmother    Stroke Paternal Grandfather    Hypertension Paternal Grandfather    Asthma  Daughter    Ovarian cancer Other    Other Neg Hx    Sleep apnea Neg Hx     ALLERGIES:  is allergic to bee venom and shrimp [shellfish allergy].  MEDICATIONS:  Current Outpatient Medications  Medication Sig Dispense Refill   etonogestrel-ethinyl estradiol (NUVARING) 0.12-0.015 MG/24HR vaginal ring NuvaRing     phentermine 37.5 MG capsule 1 tab(s) orally once a day (in the morning) for 30 days     No current facility-administered medications for this visit.    REVIEW OF SYSTEMS:   Constitutional: ( - ) fevers, ( - )  chills , ( - ) night sweats Eyes: ( - ) blurriness of vision, ( - ) double vision, ( - ) watery eyes Ears, nose, mouth, throat, and face: ( - ) mucositis, ( - ) sore throat Respiratory: ( - ) cough, ( - ) dyspnea, ( - ) wheezes Cardiovascular: ( - ) palpitation, ( - ) chest discomfort, ( - ) lower extremity swelling Gastrointestinal:  ( - ) nausea, ( - ) heartburn, ( - ) change in bowel habits Skin: ( - ) abnormal skin rashes Lymphatics: ( - ) new lymphadenopathy, ( - ) easy bruising Neurological: ( - ) numbness, ( - ) tingling, ( - ) new weaknesses Behavioral/Psych: ( - ) mood change, ( - ) new changes  All other systems were reviewed with the patient and are negative.  PHYSICAL EXAMINATION:  Vitals:   06/16/22 0832  BP: 128/81  Pulse: 70  Resp: 13  Temp: 98.5 F (36.9 C)  SpO2: 100%    Filed Weights   06/16/22 0832  Weight: 236 lb 8 oz (107.3 kg)     GENERAL: Well-appearing middle-aged African-American female, alert, no distress and comfortable SKIN: skin color, texture, turgor are normal, no rashes or significant lesions EYES: conjunctiva are pink and non-injected, sclera clear LUNGS: clear to auscultation and percussion with normal breathing effort HEART: regular rate & rhythm and no murmurs and no lower extremity edema Musculoskeletal: no cyanosis of digits and no clubbing  PSYCH: alert & oriented x 3, fluent speech NEURO: no focal motor/sensory  deficits  LABORATORY DATA:  I have reviewed the data as listed    Latest Ref Rng & Units 06/16/2022    8:10 AM 12/13/2021    8:09 AM 10/23/2021   10:22 AM  CBC  WBC 4.0 - 10.5 K/uL 9.1  8.6  11.0   Hemoglobin 12.0 - 15.0 g/dL 9.7  12.3  10.2   Hematocrit 36.0 - 46.0 % 28.7  35.2  30.9   Platelets 150 - 400 K/uL 359  271  363        Latest Ref Rng & Units 06/16/2022    8:10 AM 12/13/2021    8:09 AM 10/23/2021   10:22  AM  CMP  Glucose 70 - 99 mg/dL 84  84  86   BUN 6 - 20 mg/dL 12  15  8   $ Creatinine 0.44 - 1.00 mg/dL 0.82  0.91  0.83   Sodium 135 - 145 mmol/L 137  140  140   Potassium 3.5 - 5.1 mmol/L 4.2  4.3  3.9   Chloride 98 - 111 mmol/L 109  111  109   CO2 22 - 32 mmol/L 23  24  25   $ Calcium 8.9 - 10.3 mg/dL 8.6  8.7  8.9   Total Protein 6.5 - 8.1 g/dL 7.1  7.5  7.8   Total Bilirubin 0.3 - 1.2 mg/dL 0.2  0.2  0.2   Alkaline Phos 38 - 126 U/L 36  42  37   AST 15 - 41 U/L 23  16  19   $ ALT 0 - 44 U/L 14  15  14     $ RADIOGRAPHIC STUDIES: No results found.  ASSESSMENT & PLAN Melissa Joyce 37 y.o. female with medical history significant for iron deficiency anemia 2/2 to GYN bleeding who presents for a follow up visit.  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 daily with a source of vitamin C --Patient continues to have a distant microcytic anemia.  Will need to pursue an additional round of IV iron therapy. --Patient previously received 5 doses of IV Venofer but this did not adequately replete her iron stores.  Will attempt to have Monoferric approved instead. --labs today show white blood cell count 9.1, hemoglobin 9.7, MCV 67.1, and platelets of 359. --Plan for return  to clinic 4-6 weeks after last dose of IV iron.   No orders of the defined types were placed in this encounter.   All questions were answered. The patient knows to call the clinic with any problems, questions or concerns.  A total of more than 30 minutes were spent on this encounter with face-to-face time and non-face-to-face time, including preparing to see the patient, ordering tests and/or medications, counseling the patient and coordination of care as outlined above.   Ledell Peoples, MD Department of Hematology/Oncology [place] at [facility] Phone: [phone] Pager: [phone] Email: [email or web address]  06/16/2022 8:55 AM

## 2022-06-17 ENCOUNTER — Telehealth: Payer: Self-pay | Admitting: Hematology and Oncology

## 2022-06-17 NOTE — Telephone Encounter (Signed)
Called patient per 2/12 los notes to schedule f/u. Left voicemail  with new appointment information and contact details if needing to reschedule.

## 2022-06-26 ENCOUNTER — Telehealth: Payer: Self-pay | Admitting: Pharmacy Technician

## 2022-06-26 NOTE — Telephone Encounter (Addendum)
Dr. Lorenso Courier,  Auth Submission: APPROVED Payer: [employer] Medication & CPT/J Code(s) submitted: Monoferric (Ferrci derisomaltose) [phone] Route of submission (phone, fax, portal):  Phone # Fax # Auth type: Buy/Bill Units/visits requested: X1 Reference number: [id number] Approval from: 06/19/22 to 06/18/23   Monoferric co-pay card: APPROVED ID: [id number] [place]; [id number] PCN: [id number] GR: [id number]

## 2022-06-30 ENCOUNTER — Encounter: Payer: Self-pay | Admitting: Hematology and Oncology

## 2022-07-07 ENCOUNTER — Ambulatory Visit: Payer: Managed Care, Other (non HMO)

## 2022-07-07 VITALS — BP 123/82 | HR 67 | Temp 98.5°F | Resp 18 | Ht 61.0 in | Wt 236.6 lb

## 2022-07-07 DIAGNOSIS — N920 Excessive and frequent menstruation with regular cycle: Secondary | ICD-10-CM | POA: Diagnosis not present

## 2022-07-07 DIAGNOSIS — D500 Iron deficiency anemia secondary to blood loss (chronic): Secondary | ICD-10-CM | POA: Diagnosis not present

## 2022-07-07 MED ORDER — SODIUM CHLORIDE 0.9 % IV SOLN
1000.0000 mg | Freq: Once | INTRAVENOUS | Status: AC
  Administered 2022-07-07: 1000 mg via INTRAVENOUS
  Filled 2022-07-07: qty 10

## 2022-07-07 MED ORDER — DIPHENHYDRAMINE HCL 25 MG PO CAPS
50.0000 mg | ORAL_CAPSULE | ORAL | Status: DC | PRN
  Administered 2022-07-07: 50 mg via ORAL
  Filled 2022-07-07: qty 2

## 2022-07-07 NOTE — Progress Notes (Signed)
Diagnosis: Iron Deficiency Anemia  Provider:  Marshell Garfinkel MD  Procedure: Infusion  IV Type: Peripheral, IV Location: R Antecubital  Monoferric (Ferric Derisomaltose), Dose: 1000 mg  Infusion Start Time: [id number]  Infusion Stop Time: 1640  Post Infusion IV Care: Peripheral IV Discontinued  Discharge: Condition: Good, Destination: Home . AVS Declined  Performed by:  Fraser Din [place]-Burchett, RN

## 2022-07-17 IMAGING — CT CT ANGIO CHEST
2 of 7 series · 18 of 46 positions shown · IV contrast (omnipaque)
Comparison: None.

CLINICAL DATA: Shortness of breath.

EXAM:
CT ANGIOGRAPHY CHEST WITH CONTRAST
TECHNIQUE: Multidetector CT imaging of the chest was performed using the
standard protocol during bolus administration of intravenous
contrast. Multiplanar CT image reconstructions and MIPs were
obtained to evaluate the vascular anatomy.
CONTRAST:  55mL OMNIPAQUE IOHEXOL 350 MG/ML SOLN

[Series 7: thins · axial · 0.73mm/px · z∈[+1102,+1334]mm · 15 of 262 slices shown]
[im 15/262  lung]
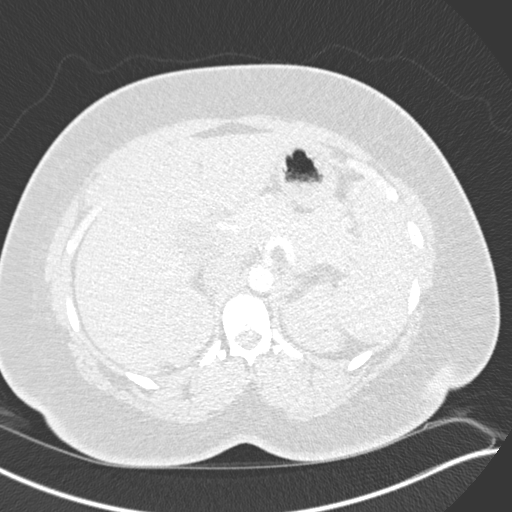
[im 30/262  soft-tissue]
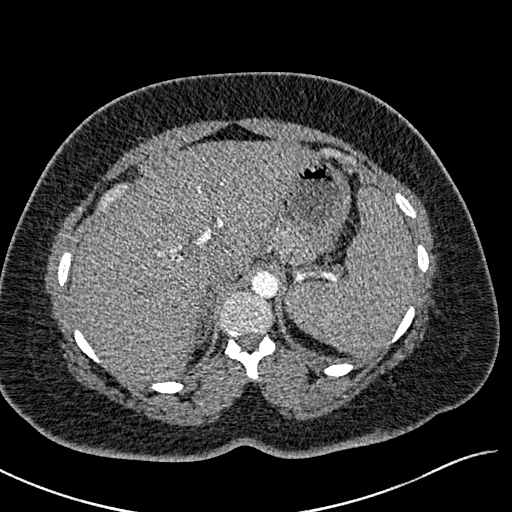
[im 44/262  lung]
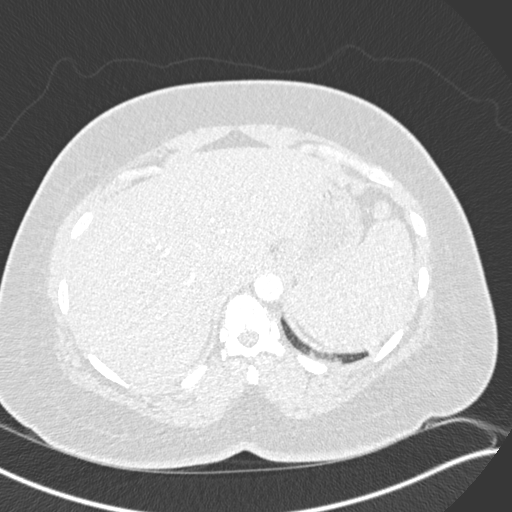
[im 59/262  soft-tissue]
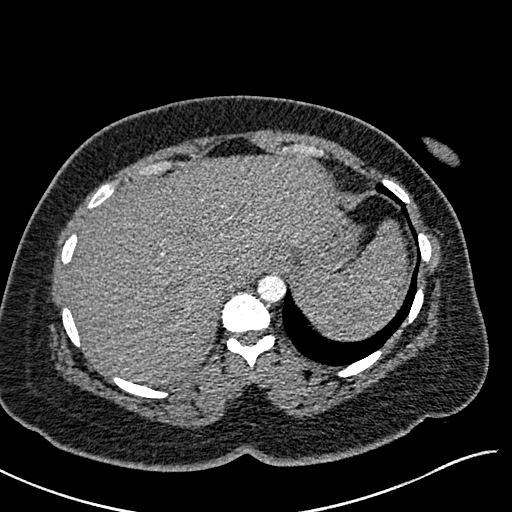
[im 88/262  lung]
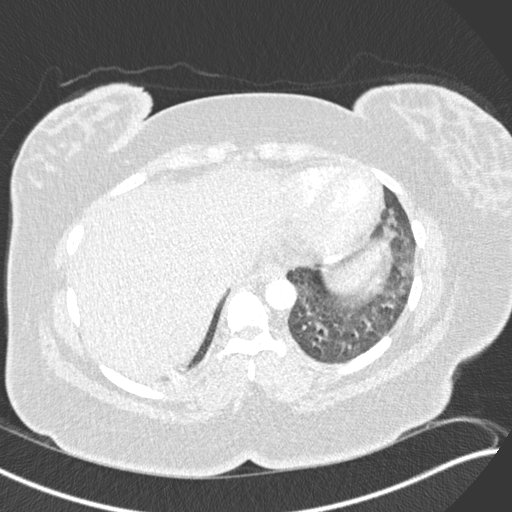
[im 102/262  soft-tissue]
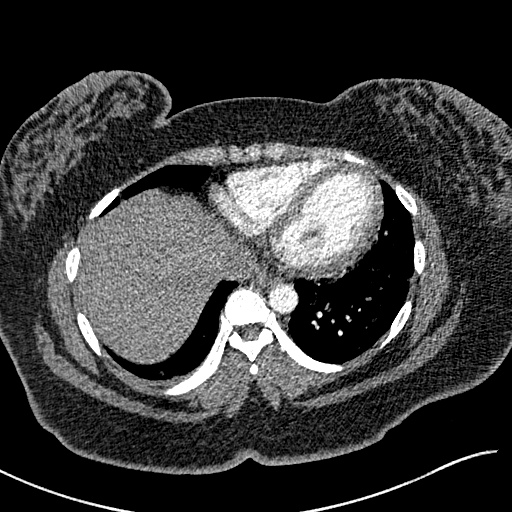
[im 117/262  lung]
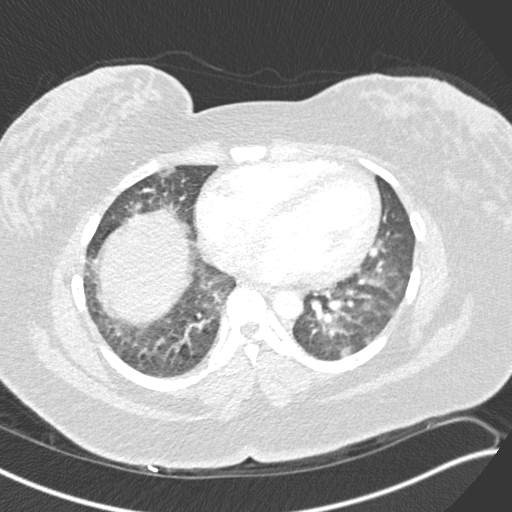
[im 131/262  soft-tissue]
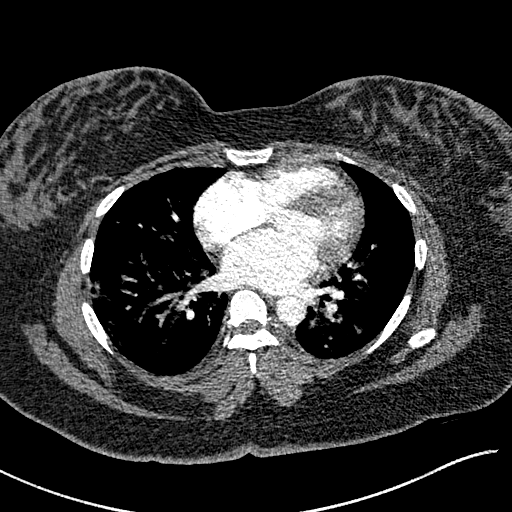
[im 146/262  lung]
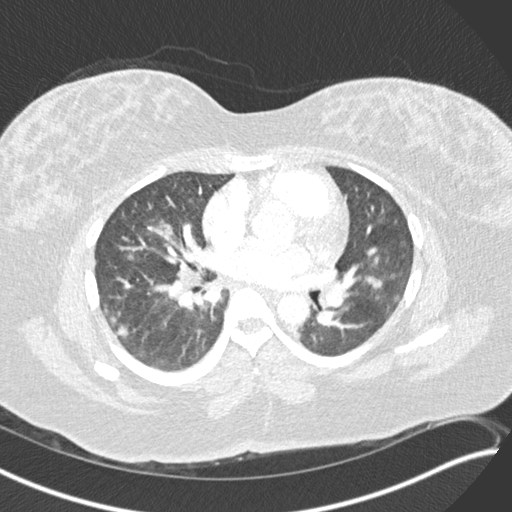
[im 160/262  soft-tissue]
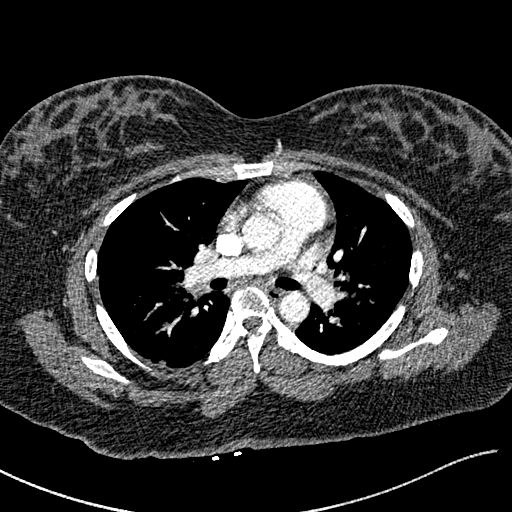
[im 175/262  lung]
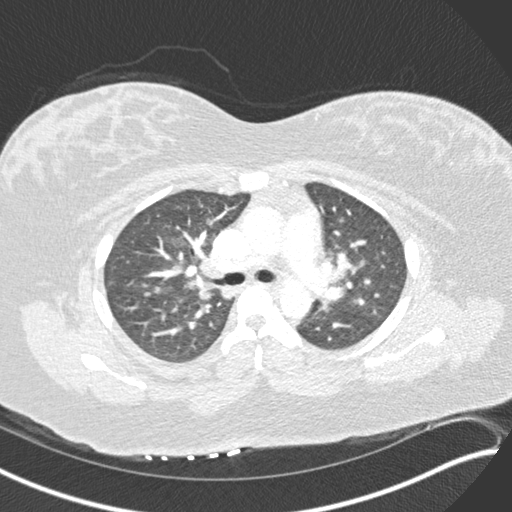
[im 204/262  soft-tissue]
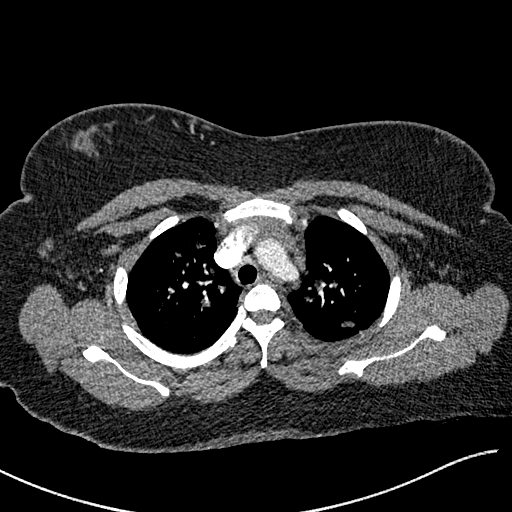
[im 218/262  lung]
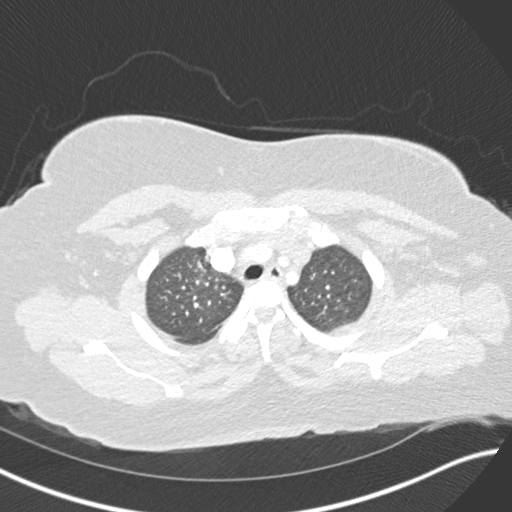
[im 233/262  soft-tissue]
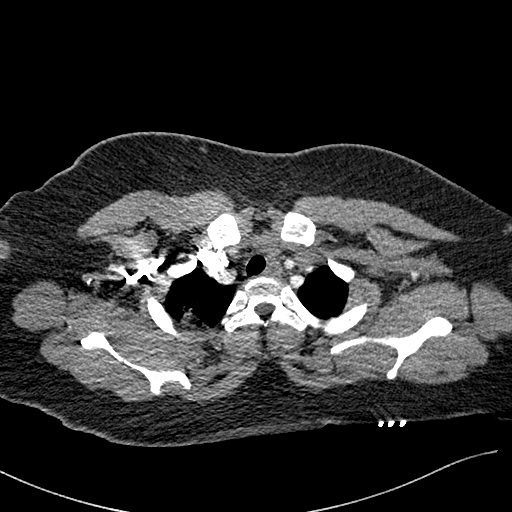
[im 247/262  lung]
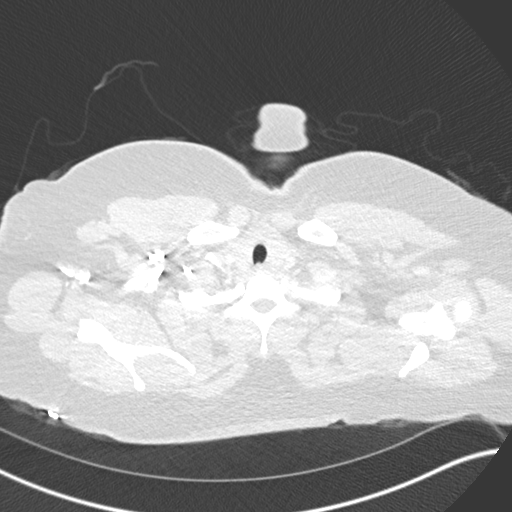

[Series 9: coronal mpr · coronal · 0.58mm/px · 3 of 151 slices shown]
[im 38/151  soft-tissue]
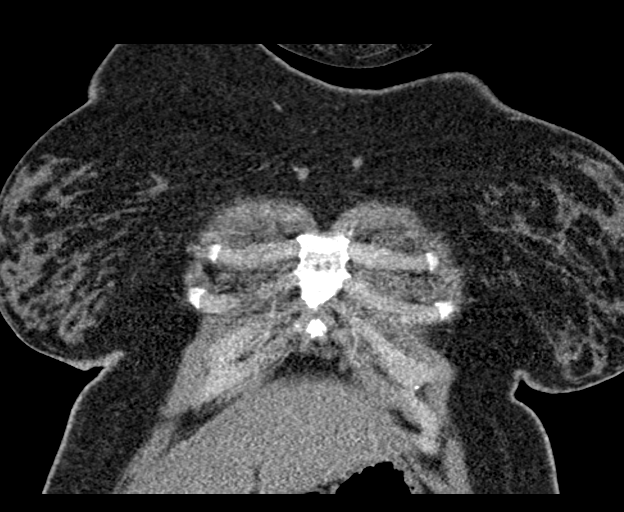
[im 76/151  soft-tissue]
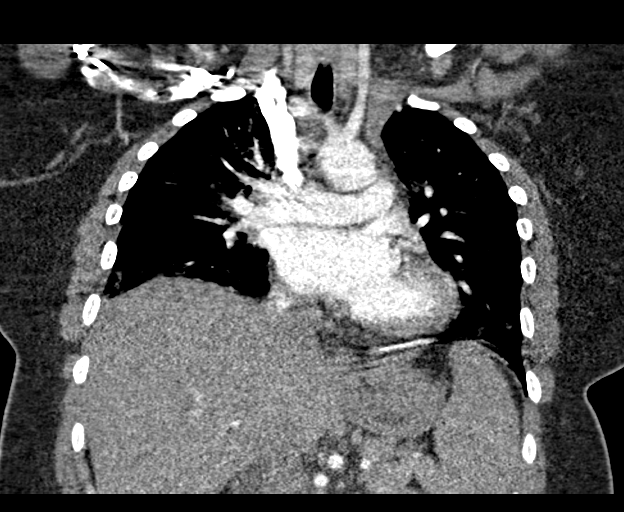
[im 113/151  soft-tissue]
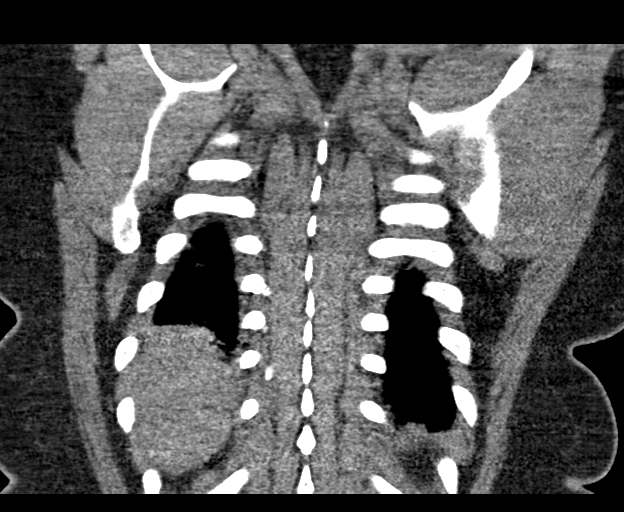

[18 of 46 positions shown; findings below may reference images not displayed]

FINDINGS: Cardiovascular: Satisfactory opacification of the pulmonary arteries
to the segmental level. No evidence of pulmonary embolism. Normal
heart size. No pericardial effusion.

Mediastinum/Nodes: No enlarged mediastinal, hilar, or axillary lymph
nodes. Thyroid gland, trachea, and esophagus demonstrate no
significant findings.

Lungs/Pleura: Mild multifocal infiltrates are seen scattered
throughout both lungs. This is slightly more prominent within the
bilateral lower lobes.

There is no evidence of a pleural effusion or pneumothorax.

Upper Abdomen: No acute abnormality.

Musculoskeletal: No chest wall abnormality. No acute or significant
osseous findings.

Review of the MIP images confirms the above findings.
IMPRESSION: 1. No evidence of pulmonary embolism.
2. Mild multifocal infiltrates scattered throughout both lungs,
slightly more prominent within the bilateral lower lobes.

## 2022-08-26 ENCOUNTER — Telehealth: Payer: Self-pay

## 2022-08-26 NOTE — Telephone Encounter (Signed)
Pt wanted to know if she could dropped her FMLA paper work of for an updated copy. I let the pat know she can drop them off here. Pt verbalized understanding.  Rondel Jumbo, CMA

## 2022-09-09 ENCOUNTER — Encounter: Payer: Self-pay | Admitting: Hematology and Oncology

## 2022-09-10 ENCOUNTER — Telehealth: Payer: Self-pay | Admitting: Pharmacy Technician

## 2022-09-10 NOTE — Telephone Encounter (Signed)
Auth Submission: APPROVED Site of care: Site of care: [facility] Payer: [employer] Medication & CPT/J Code(s) submitted: Monoferric (Ferrci derisomaltose) [phone] Route of submission (phone, fax, portal):  Phone # Fax # Auth type: Buy/Bill Units/visits requested: 1 Reference number: [id number] Approval from: 09/09/22 to 10/09/22

## 2022-09-15 ENCOUNTER — Other Ambulatory Visit: Payer: Managed Care, Other (non HMO)

## 2022-09-16 ENCOUNTER — Other Ambulatory Visit: Payer: Self-pay | Admitting: Hematology and Oncology

## 2022-09-16 DIAGNOSIS — D500 Iron deficiency anemia secondary to blood loss (chronic): Secondary | ICD-10-CM

## 2022-09-16 NOTE — Progress Notes (Unsigned)
[facility] [phone]   [phone]  PROGRESS NOTE  Patient Care Team: Norm Salt, PA as PCP - General (Physician Assistant) Hermina Staggers, MD as Consulting Physician (Obstetrics and Gynecology)  Hematological/Oncological History # Iron Deficiency Anemia 2/2 to GYN Bleeding 12/11/2019: WBC 9.1, Hgb 11.4, MCV 84.5, Plt 296 09/17/2021: WBC 9.8, Hgb 9.8, MCV 70, Plt 442. TIBC 498, Iron sat 4% 10/23/2021: establish care with Dr. Leonides Schanz  6/22-7/20/2023: IV iron sucrose 200 mg x 5 doses 12/13/2021: white blood cell count 8.6, hemoglobin 12.3, MCV 71.5, and platelets of 271.   06/16/2022:  white blood cell count 9.1, hemoglobin 9.7, MCV 67.1, and platelets of 359. 07/07/2022: Monoferric 1000 mg IV administered.   Interval History:  Melissa Joyce 37 y.o. female with medical history significant for iron deficiency anemia 2/2 to GYN bleeding who presents for a follow up visit. The patient's last visit was on 06/16/2022. In the interim since the last visit she received monoferric on 07/07/2022.   On exam today Melissa Joyce reports she received her dose of IV Monoferric and tolerated well but unfortunate still feels weak.  She reports had a heavy menstrual cycle almost immediately after receiving the IV iron therapy.  She reports it was the worst cycle she has had since her delivery of her child 2 years ago.  She reports that she did not feel as good as she did with her prior dose of IV iron therapy.  She notes she continues take iron pills and is tolerating it well without any stomach upset.  She has she is also taking birth control and upcoming OB/GYN appointment on 10/09/2022.  She is doing her best try to eat iron rich food and exercise.  She is not having any bleeding from any other source.  She notes that she is having some lightheadedness but no dizziness or shortness of breath.  She has been having severe headaches and has had to leave work before due to headaches and  blurred vision.  She is not having any fevers, chills, sweats, nausea, or diarrhea.  Full 10 point ROS is listed below.  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   Postpartum care following cesarean delivery (09/22/12) 09/23/2012   Pregnant    S/P repeat low transverse C-section 09/23/2012   Sickle cell trait (HCC)     SURGICAL HISTORY: Past Surgical History:  Procedure Laterality Date   CESAREAN SECTION  2005   CESAREAN SECTION N/A 09/22/2012   Procedure: CESAREAN SECTION;  Surgeon: Serita Kyle, MD;  Location: [facility];  Service: Obstetrics;  Laterality: N/A;   CESAREAN SECTION N/A 12/03/2019   Procedure: REPEAT CESAREAN SECTION ;  Surgeon: Shea Evans, MD;  Location: [facility];  Service: Obstetrics;  Laterality: N/A;    SOCIAL HISTORY: Social History   Socioeconomic History   Marital status: Single    Spouse name: Not on file   Number of children: Not on file   Years of education: Not on file   Highest education level: Not on file  Occupational History   Not on file  Tobacco Use   Smoking status: Never   Smokeless tobacco: Never  Vaping Use   Vaping Use: Never used  Substance and Sexual Activity   Alcohol use: Not Currently    Comment: wine on ocassion   Drug use: No   Sexual activity: Yes  Other Topics  Concern   Not on file  Social History Narrative   Not on file   Social Determinants of Health   Financial Resource Strain: Not on file  Food Insecurity: Not on file  Transportation Needs: Not on file  Physical Activity: Not on file  Stress: Not on file  Social Connections: Not on file  Intimate Partner Violence: Not on file    FAMILY HISTORY: Family History  Problem Relation Age of Onset   Anemia Mother    Cervical cancer Mother    Heart disease Father    Asthma Sister    Asthma Brother    Cervical cancer Maternal Grandmother    Skin cancer Maternal Grandmother     Stroke Paternal Grandmother    Hypertension Paternal Grandmother    Stroke Paternal Grandfather    Hypertension Paternal Grandfather    Asthma Daughter    Ovarian cancer Other    Other Neg Hx    Sleep apnea Neg Hx     ALLERGIES:  is allergic to bee venom and shrimp [shellfish allergy].  MEDICATIONS:  Current Outpatient Medications  Medication Sig Dispense Refill   etonogestrel-ethinyl estradiol (NUVARING) 0.12-0.015 MG/24HR vaginal ring NuvaRing     phentermine 37.5 MG capsule 1 tab(s) orally once a day (in the morning) for 30 days     No current facility-administered medications for this visit.    REVIEW OF SYSTEMS:   Constitutional: ( - ) fevers, ( - )  chills , ( - ) night sweats Eyes: ( - ) blurriness of vision, ( - ) double vision, ( - ) watery eyes Ears, nose, mouth, throat, and face: ( - ) mucositis, ( - ) sore throat Respiratory: ( - ) cough, ( - ) dyspnea, ( - ) wheezes Cardiovascular: ( - ) palpitation, ( - ) chest discomfort, ( - ) lower extremity swelling Gastrointestinal:  ( - ) nausea, ( - ) heartburn, ( - ) change in bowel habits Skin: ( - ) abnormal skin rashes Lymphatics: ( - ) new lymphadenopathy, ( - ) easy bruising Neurological: ( - ) numbness, ( - ) tingling, ( - ) new weaknesses Behavioral/Psych: ( - ) mood change, ( - ) new changes  All other systems were reviewed with the patient and are negative.  PHYSICAL EXAMINATION:  Vitals:   09/17/22 0832  BP: 124/73  Pulse: 62  Resp: 13  Temp: 98.2 F (36.8 C)  SpO2: 100%     Filed Weights   09/17/22 0832  Weight: 240 lb (108.9 kg)      GENERAL: Well-appearing middle-aged African-American female, alert, no distress and comfortable SKIN: skin color, texture, turgor are normal, no rashes or significant lesions EYES: conjunctiva are pink and non-injected, sclera clear LUNGS: clear to auscultation and percussion with normal breathing effort HEART: regular rate & rhythm and no murmurs and no lower  extremity edema Musculoskeletal: no cyanosis of digits and no clubbing  PSYCH: alert & oriented x 3, fluent speech NEURO: no focal motor/sensory deficits  LABORATORY DATA:  I have reviewed the data as listed    Latest Ref Rng & Units 09/17/2022    8:08 AM 06/16/2022    8:10 AM 12/13/2021    8:09 AM  CBC  WBC 4.0 - 10.5 K/uL 8.5  9.1  8.6   Hemoglobin 12.0 - 15.0 g/dL 04.5  9.7  40.9   Hematocrit 36.0 - 46.0 % 36.9  28.7  35.2   Platelets 150 - 400 K/uL 370  359  271        Latest Ref Rng & Units 09/17/2022    8:08 AM 06/16/2022    8:10 AM 12/13/2021    8:09 AM  CMP  Glucose 70 - 99 mg/dL 88  84  84   BUN 6 - 20 mg/dL 9  12  15    Creatinine 0.44 - 1.00 mg/dL 9.60  4.54  0.98   Sodium 135 - 145 mmol/L 138  137  140   Potassium 3.5 - 5.1 mmol/L 4.1  4.2  4.3   Chloride 98 - 111 mmol/L 106  109  111   CO2 22 - 32 mmol/L 26  23  24    Calcium 8.9 - 10.3 mg/dL 8.4  8.6  8.7   Total Protein 6.5 - 8.1 g/dL 7.6  7.1  7.5   Total Bilirubin 0.3 - 1.2 mg/dL 0.2  0.2  0.2   Alkaline Phos 38 - 126 U/L 42  36  42   AST 15 - 41 U/L 29  23  16    ALT 0 - 44 U/L 21  14  15      RADIOGRAPHIC STUDIES: No results found.  ASSESSMENT & PLAN Melissa Joyce 37 y.o. female with medical history significant for iron deficiency anemia 2/2 to GYN bleeding who presents for a follow up visit.  # Iron Deficiency Anemia 2/2 to GYN Bleeding -- Findings are consistent with iron deficiency anemia secondary to patient's menorrhagia --Encouraged her to follow-up with OB/GYN for better control of her menstrual cycles.  Next visit on 10/09/2022 -- Today we will recheck iron panel and ferritin as well as reticulocytes, CBC, and CMP --Continue iron polysaccharide 150 mg p.o. daily with a source of vitamin C --due to persistent microcytosis will assess for Thalassemia.  --Patient previously received 5 doses of IV Venofer but this did not adequately replete her iron stores.  Monoferric administered on 07/07/2022.  --labs  today show WBC 8.5, Hgb 12.5, MCV 74.5, Plt 370. --Plan for return to clinic jn 3 months time of sooner if IV iron is needed.  No orders of the defined types were placed in this encounter.   All questions were answered. The patient knows to call the clinic with any problems, questions or concerns.  A total of more than 30 minutes were spent on this encounter with face-to-face time and non-face-to-face time, including preparing to see the patient, ordering tests and/or medications, counseling the patient and coordination of care as outlined above.   Ulysees Barns, MD Department of Hematology/Oncology [facility] Phone: [phone] Pager: [phone] Email: [email or web address]  09/17/2022 9:09 AM

## 2022-09-17 ENCOUNTER — Inpatient Hospital Stay (HOSPITAL_BASED_OUTPATIENT_CLINIC_OR_DEPARTMENT_OTHER): Payer: Managed Care, Other (non HMO) | Admitting: Hematology and Oncology

## 2022-09-17 ENCOUNTER — Inpatient Hospital Stay: Payer: Managed Care, Other (non HMO) | Attending: Hematology and Oncology

## 2022-09-17 VITALS — BP 124/73 | HR 62 | Temp 98.2°F | Resp 13 | Wt 240.0 lb

## 2022-09-17 DIAGNOSIS — Z91030 Bee allergy status: Secondary | ICD-10-CM | POA: Diagnosis not present

## 2022-09-17 DIAGNOSIS — Z793 Long term (current) use of hormonal contraceptives: Secondary | ICD-10-CM | POA: Diagnosis not present

## 2022-09-17 DIAGNOSIS — Z832 Family history of diseases of the blood and blood-forming organs and certain disorders involving the immune mechanism: Secondary | ICD-10-CM | POA: Diagnosis not present

## 2022-09-17 DIAGNOSIS — R519 Headache, unspecified: Secondary | ICD-10-CM | POA: Diagnosis not present

## 2022-09-17 DIAGNOSIS — H538 Other visual disturbances: Secondary | ICD-10-CM | POA: Diagnosis not present

## 2022-09-17 DIAGNOSIS — Z8049 Family history of malignant neoplasm of other genital organs: Secondary | ICD-10-CM | POA: Diagnosis not present

## 2022-09-17 DIAGNOSIS — Z825 Family history of asthma and other chronic lower respiratory diseases: Secondary | ICD-10-CM | POA: Diagnosis not present

## 2022-09-17 DIAGNOSIS — Z8249 Family history of ischemic heart disease and other diseases of the circulatory system: Secondary | ICD-10-CM | POA: Insufficient documentation

## 2022-09-17 DIAGNOSIS — Z8041 Family history of malignant neoplasm of ovary: Secondary | ICD-10-CM | POA: Insufficient documentation

## 2022-09-17 DIAGNOSIS — R531 Weakness: Secondary | ICD-10-CM | POA: Insufficient documentation

## 2022-09-17 DIAGNOSIS — D500 Iron deficiency anemia secondary to blood loss (chronic): Secondary | ICD-10-CM | POA: Diagnosis present

## 2022-09-17 DIAGNOSIS — Z808 Family history of malignant neoplasm of other organs or systems: Secondary | ICD-10-CM | POA: Insufficient documentation

## 2022-09-17 DIAGNOSIS — Z823 Family history of stroke: Secondary | ICD-10-CM | POA: Insufficient documentation

## 2022-09-17 DIAGNOSIS — Z79899 Other long term (current) drug therapy: Secondary | ICD-10-CM | POA: Diagnosis not present

## 2022-09-17 DIAGNOSIS — Z98891 History of uterine scar from previous surgery: Secondary | ICD-10-CM | POA: Insufficient documentation

## 2022-09-17 DIAGNOSIS — R42 Dizziness and giddiness: Secondary | ICD-10-CM | POA: Insufficient documentation

## 2022-09-17 DIAGNOSIS — D509 Iron deficiency anemia, unspecified: Secondary | ICD-10-CM | POA: Insufficient documentation

## 2022-09-17 LAB — RETIC PANEL
Immature Retic Fract: 21.9 % — ABNORMAL HIGH (ref 2.3–15.9)
RBC.: 4.9 MIL/uL (ref 3.87–5.11)
Retic Count, Absolute: 60.8 10*3/uL (ref 19.0–186.0)
Retic Ct Pct: 1.2 % (ref 0.4–3.1)
Reticulocyte Hemoglobin: 27.9 pg — ABNORMAL LOW (ref >27.9)

## 2022-09-17 LAB — CMP (CANCER CENTER ONLY)
ALT: 21 U/L (ref 0–44)
AST: 29 U/L (ref 15–41)
Albumin: 3.8 g/dL (ref 3.5–5.0)
Alkaline Phosphatase: 42 U/L (ref 38–126)
Anion gap: 6 (ref 5–15)
BUN: 9 mg/dL (ref 6–20)
CO2: 26 mmol/L (ref 22–32)
Calcium: 8.4 mg/dL — ABNORMAL LOW (ref 8.9–10.3)
Chloride: 106 mmol/L (ref 98–111)
Creatinine: 0.81 mg/dL (ref 0.44–1.00)
GFR, Estimated: >60 mL/min (ref >60)
Glucose, Bld: 88 mg/dL (ref 70–99)
Potassium: 4.1 mmol/L (ref 3.5–5.1)
Sodium: 138 mmol/L (ref 135–145)
Total Bilirubin: 0.2 mg/dL — ABNORMAL LOW (ref 0.3–1.2)
Total Protein: 7.6 g/dL (ref 6.5–8.1)

## 2022-09-17 LAB — IRON AND IRON BINDING CAPACITY (CC-WL,HP ONLY)
Iron: 23 ug/dL — ABNORMAL LOW (ref 28–170)
Saturation Ratios: 4 % — ABNORMAL LOW (ref 10.4–31.8)
TIBC: 542 ug/dL — ABNORMAL HIGH (ref 250–450)
UIBC: 519 ug/dL — ABNORMAL HIGH (ref 148–442)

## 2022-09-17 LAB — CBC WITH DIFFERENTIAL (CANCER CENTER ONLY)
Abs Immature Granulocytes: 0.02 10*3/uL (ref 0.00–0.07)
Basophils Absolute: 0.1 10*3/uL (ref 0.0–0.1)
Basophils Relative: 1 %
Eosinophils Absolute: 0.2 10*3/uL (ref 0.0–0.5)
Eosinophils Relative: 2 %
HCT: 36.9 % (ref 36.0–46.0)
Hemoglobin: 12.5 g/dL (ref 12.0–15.0)
Immature Granulocytes: 0 %
Lymphocytes Relative: 32 %
Lymphs Abs: 2.7 10*3/uL (ref 0.7–4.0)
MCH: 25.3 pg — ABNORMAL LOW (ref 26.0–34.0)
MCHC: 33.9 g/dL (ref 30.0–36.0)
MCV: 74.5 fL — ABNORMAL LOW (ref 80.0–100.0)
Monocytes Absolute: 0.4 10*3/uL (ref 0.1–1.0)
Monocytes Relative: 5 %
Neutro Abs: 5.1 10*3/uL (ref 1.7–7.7)
Neutrophils Relative %: 60 %
Platelet Count: 370 10*3/uL (ref 150–400)
RBC: 4.95 MIL/uL (ref 3.87–5.11)
RDW: 17.6 % — ABNORMAL HIGH (ref 11.5–15.5)
WBC Count: 8.5 10*3/uL (ref 4.0–10.5)
nRBC: 0 % (ref 0.0–0.2)

## 2022-09-18 LAB — FERRITIN: Ferritin: 8 ng/mL — ABNORMAL LOW (ref 11–307)

## 2022-09-22 ENCOUNTER — Ambulatory Visit: Payer: Managed Care, Other (non HMO) | Admitting: Hematology and Oncology

## 2022-09-22 LAB — HGB FRACTIONATION CASCADE

## 2022-09-22 LAB — HGB FRACTIONATION BY HPLC
Hgb A2: 3.2 % (ref 1.8–3.2)
Hgb A: 56.7 % — ABNORMAL LOW (ref 96.4–98.8)
Hgb C: 39 % — ABNORMAL HIGH
Hgb E: 0 %
Hgb F: 1.1 % (ref 0.0–2.0)
Hgb S: 0 %
Hgb Variant: 0 %

## 2022-10-02 ENCOUNTER — Telehealth: Payer: Self-pay | Admitting: *Deleted

## 2022-10-02 NOTE — Telephone Encounter (Signed)
Received vm from pt inquiring about her lab results from 09/17/22.  Please advise on recommendations please

## 2022-10-06 ENCOUNTER — Telehealth: Payer: Self-pay | Admitting: *Deleted

## 2022-10-06 NOTE — Telephone Encounter (Signed)
TCT patient regarding recent lab results. No answer but was able to leave vm message for pt to return call at her convenience. Per Dr. Leonides Schanz: Advised that her labs show her iron levels are still low, but she is not anemic. Dr. Leonides Schanz recommends she continue her PO iron daily (or QOD if she cannot tolerate it). 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 There is no specific treatment for this condition, though it does explain why her RBC are always small. We will plan to see her back in August 2024 to reasses.

## 2022-10-08 ENCOUNTER — Institutional Professional Consult (permissible substitution): Payer: Managed Care, Other (non HMO) | Admitting: Neurology

## 2022-10-10 LAB — ALPHA-THALASSEMIA GENOTYPR

## 2022-11-03 DIAGNOSIS — Z87820 Personal history of traumatic brain injury: Secondary | ICD-10-CM | POA: Diagnosis not present

## 2022-11-03 DIAGNOSIS — N920 Excessive and frequent menstruation with regular cycle: Secondary | ICD-10-CM | POA: Diagnosis not present

## 2022-11-03 DIAGNOSIS — R5383 Other fatigue: Secondary | ICD-10-CM | POA: Diagnosis not present

## 2022-11-03 DIAGNOSIS — D508 Other iron deficiency anemias: Secondary | ICD-10-CM | POA: Diagnosis not present

## 2022-11-03 DIAGNOSIS — Z8249 Family history of ischemic heart disease and other diseases of the circulatory system: Secondary | ICD-10-CM | POA: Diagnosis not present

## 2022-11-03 DIAGNOSIS — B009 Herpesviral infection, unspecified: Secondary | ICD-10-CM | POA: Diagnosis not present

## 2022-11-23 ENCOUNTER — Emergency Department (HOSPITAL_COMMUNITY): Payer: 59

## 2022-11-23 ENCOUNTER — Encounter: Payer: Self-pay | Admitting: Hematology and Oncology

## 2022-11-23 ENCOUNTER — Emergency Department (HOSPITAL_COMMUNITY)
Admission: EM | Admit: 2022-11-23 | Discharge: 2022-11-23 | Disposition: A | Discharge status: Home / Self Care | Payer: 59 | Attending: Emergency Medicine | Admitting: Emergency Medicine

## 2022-11-23 ENCOUNTER — Encounter (HOSPITAL_COMMUNITY): Payer: Self-pay

## 2022-11-23 ENCOUNTER — Other Ambulatory Visit: Payer: Self-pay

## 2022-11-23 DIAGNOSIS — S5012XA Contusion of left forearm, initial encounter: Secondary | ICD-10-CM | POA: Diagnosis not present

## 2022-11-23 DIAGNOSIS — S20212A Contusion of left front wall of thorax, initial encounter: Secondary | ICD-10-CM | POA: Insufficient documentation

## 2022-11-23 DIAGNOSIS — Y92410 Unspecified street and highway as the place of occurrence of the external cause: Secondary | ICD-10-CM | POA: Insufficient documentation

## 2022-11-23 DIAGNOSIS — S8011XA Contusion of right lower leg, initial encounter: Secondary | ICD-10-CM | POA: Diagnosis not present

## 2022-11-23 DIAGNOSIS — Z041 Encounter for examination and observation following transport accident: Secondary | ICD-10-CM | POA: Diagnosis not present

## 2022-11-23 DIAGNOSIS — S0990XA Unspecified injury of head, initial encounter: Secondary | ICD-10-CM | POA: Diagnosis not present

## 2022-11-23 DIAGNOSIS — M79661 Pain in right lower leg: Secondary | ICD-10-CM | POA: Diagnosis not present

## 2022-11-23 DIAGNOSIS — S40022A Contusion of left upper arm, initial encounter: Secondary | ICD-10-CM

## 2022-11-23 DIAGNOSIS — S199XXA Unspecified injury of neck, initial encounter: Secondary | ICD-10-CM | POA: Diagnosis not present

## 2022-11-23 DIAGNOSIS — S20312A Abrasion of left front wall of thorax, initial encounter: Secondary | ICD-10-CM | POA: Diagnosis not present

## 2022-11-23 LAB — I-STAT CHEM 8, ED
BUN: 9 mg/dL (ref 6–20)
Calcium, Ion: 1.19 mmol/L (ref 1.15–1.40)
Chloride: 106 mmol/L (ref 98–111)
Creatinine, Ser: 0.9 mg/dL (ref 0.44–1.00)
Glucose, Bld: 87 mg/dL (ref 70–99)
HCT: 30 % — ABNORMAL LOW (ref 36.0–46.0)
Hemoglobin: 10.2 g/dL — ABNORMAL LOW (ref 12.0–15.0)
Potassium: 3.5 mmol/L (ref 3.5–5.1)
Sodium: 140 mmol/L (ref 135–145)
TCO2: 21 mmol/L — ABNORMAL LOW (ref 22–32)

## 2022-11-23 LAB — POC URINE PREG, ED: Preg Test, Ur: NEGATIVE

## 2022-11-23 MED ORDER — IBUPROFEN 600 MG PO TABS: 600.0000 mg | ORAL_TABLET | Freq: Four times a day (QID) | ORAL | 0 refills | Status: AC | PRN

## 2022-11-23 MED ORDER — OXYCODONE-ACETAMINOPHEN 5-325 MG PO TABS
1.0000 | ORAL_TABLET | Freq: Once | ORAL | Status: AC
  Administered 2022-11-23: 1 via ORAL
  Filled 2022-11-23: qty 1

## 2022-11-23 MED ORDER — CYCLOBENZAPRINE HCL 10 MG PO TABS: 10.0000 mg | ORAL_TABLET | Freq: Two times a day (BID) | ORAL | 0 refills | Status: DC | PRN
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
## 2022-11-28 ENCOUNTER — Telehealth: Payer: Self-pay | Admitting: Hematology and Oncology

## 2022-12-01 DIAGNOSIS — M542 Cervicalgia: Secondary | ICD-10-CM | POA: Diagnosis not present

## 2022-12-01 DIAGNOSIS — M25561 Pain in right knee: Secondary | ICD-10-CM | POA: Diagnosis not present

## 2022-12-18 ENCOUNTER — Ambulatory Visit: Payer: Managed Care, Other (non HMO) | Admitting: Hematology and Oncology

## 2022-12-18 ENCOUNTER — Other Ambulatory Visit: Payer: Managed Care, Other (non HMO)

## 2022-12-22 ENCOUNTER — Other Ambulatory Visit: Payer: Self-pay | Admitting: Physician Assistant

## 2022-12-22 DIAGNOSIS — D500 Iron deficiency anemia secondary to blood loss (chronic): Secondary | ICD-10-CM

## 2022-12-23 ENCOUNTER — Inpatient Hospital Stay: Payer: Managed Care, Other (non HMO) | Admitting: Physician Assistant

## 2022-12-23 ENCOUNTER — Inpatient Hospital Stay: Payer: Managed Care, Other (non HMO) | Attending: Hematology and Oncology

## 2023-05-19 ENCOUNTER — Telehealth: Payer: Self-pay | Admitting: Internal Medicine

## 2023-05-19 ENCOUNTER — Encounter: Payer: Self-pay | Admitting: Hematology and Oncology

## 2023-05-25 NOTE — Progress Notes (Unsigned)
[facility] [phone]   [phone]  PROGRESS NOTE  Patient Care Team: Norm Salt, PA as PCP - General (Physician Assistant) Hermina Staggers, MD (Inactive) as Consulting Physician (Obstetrics and Gynecology)  Hematological/Oncological History # Iron Deficiency Anemia 2/2 to GYN Bleeding 12/11/2019: WBC 9.1, Hgb 11.4, MCV 84.5, Plt 296 09/17/2021: WBC 9.8, Hgb 9.8, MCV 70, Plt 442. TIBC 498, Iron sat 4% 10/23/2021: establish care with Dr. Leonides Schanz  6/22-7/20/2023: IV iron sucrose 200 mg x 5 doses 12/13/2021: white blood cell count 8.6, hemoglobin 12.3, MCV 71.5, and platelets of 271.   06/16/2022:  white blood cell count 9.1, hemoglobin 9.7, MCV 67.1, and platelets of 359. 07/07/2022: Monoferric 1000 mg IV administered.   Interval History:  Melissa Joyce 38 y.o. female with medical history significant for iron deficiency anemia 2/2 to GYN bleeding who presents for a follow up visit. The patient's last visit was on 09/17/2022.   On exam today Melissa Joyce reports worsening fatigue several months which does interfere with her daily activities.  She adds that she needs to rest frequently.  Patient reports that she does have dizzy spells but denies any syncopal episodes.  She is craving ice chips and flour again.  She adds that she goes through a bag of flour per week.  She continues to have heavy menstrual bleeding.  She reports being on hormone therapy with NuvaRing for many years.  She has been compliant with taking her iron polysaccharide daily as prescribed.  She is not having any fevers, chills, sweats, nausea, or diarrhea.  Full 10 point ROS is listed below.  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   Postpartum care following cesarean delivery (09/22/12) 09/23/2012   Pregnant    S/P repeat low transverse C-section 09/23/2012   Sickle cell trait (HCC)     SURGICAL  HISTORY: Past Surgical History:  Procedure Laterality Date   CESAREAN SECTION  2005   CESAREAN SECTION N/A 09/22/2012   Procedure: CESAREAN SECTION;  Surgeon: Serita Kyle, MD;  Location: [facility];  Service: Obstetrics;  Laterality: N/A;   CESAREAN SECTION N/A 12/03/2019   Procedure: REPEAT CESAREAN SECTION ;  Surgeon: Shea Evans, MD;  Location: [facility];  Service: Obstetrics;  Laterality: N/A;    SOCIAL HISTORY: Social History   Socioeconomic History   Marital status: Single    Spouse name: Not on file   Number of children: Not on file   Years of education: Not on file   Highest education level: Not on file  Occupational History   Not on file  Tobacco Use   Smoking status: Never   Smokeless tobacco: Never  Vaping Use   Vaping status: Never Used  Substance and Sexual Activity   Alcohol use: Not Currently    Comment: wine on ocassion   Drug use: No   Sexual activity: Yes  Other Topics Concern   Not on file  Social History Narrative   Not on file   Social Drivers of Health   Financial Resource Strain: Not on file  Food Insecurity: Not on file  Transportation Needs: Not on file  Physical Activity: Not on file  Stress: Not on file  Social Connections: Not on file  Intimate Partner Violence: Not on file    FAMILY HISTORY: Family History  Problem Relation Age of Onset   Anemia Mother    Cervical cancer  Mother    Heart disease Father    Asthma Sister    Asthma Brother    Cervical cancer Maternal Grandmother    Skin cancer Maternal Grandmother    Stroke Paternal Grandmother    Hypertension Paternal Grandmother    Stroke Paternal Grandfather    Hypertension Paternal Grandfather    Asthma Daughter    Ovarian cancer Other    Other Neg Hx    Sleep apnea Neg Hx     ALLERGIES:  is allergic to bee venom and shrimp [shellfish allergy].  MEDICATIONS:  Current Outpatient Medications  Medication Sig Dispense Refill   cyclobenzaprine (FLEXERIL) 10 MG  tablet Take 1 tablet (10 mg total) by mouth 2 (two) times daily as needed for up to 20 doses for muscle spasms. 20 tablet 0   etonogestrel-ethinyl estradiol (NUVARING) 0.12-0.015 MG/24HR vaginal ring NuvaRing     phentermine 37.5 MG capsule 1 tab(s) orally once a day (in the morning) for 30 days     No current facility-administered medications for this visit.    REVIEW OF SYSTEMS:   Constitutional: ( - ) fevers, ( - )  chills , ( - ) night sweats Eyes: ( - ) blurriness of vision, ( - ) double vision, ( - ) watery eyes Ears, nose, mouth, throat, and face: ( - ) mucositis, ( - ) sore throat Respiratory: ( - ) cough, ( - ) dyspnea, ( - ) wheezes Cardiovascular: ( - ) palpitation, ( - ) chest discomfort, ( - ) lower extremity swelling Gastrointestinal:  ( - ) nausea, ( - ) heartburn, ( - ) change in bowel habits Skin: ( - ) abnormal skin rashes Lymphatics: ( - ) new lymphadenopathy, ( - ) easy bruising Neurological: ( - ) numbness, ( - ) tingling, ( - ) new weaknesses Behavioral/Psych: ( - ) mood change, ( - ) new changes  All other systems were reviewed with the patient and are negative.  PHYSICAL EXAMINATION:  Vitals:   05/26/23 0858  BP: 137/73  Pulse: 77  Resp: 18  Temp: 98.5 F (36.9 C)  SpO2: 100%      Filed Weights   05/26/23 0858  Weight: 233 lb 4.8 oz (105.8 kg)    GENERAL: Well-appearing middle-aged African-American female, alert, no distress and comfortable SKIN: skin color, texture, turgor are normal, no rashes or significant lesions EYES: conjunctiva are pink and non-injected, sclera clear, +pallor in inner eyelids LUNGS: clear to auscultation and percussion with normal breathing effort HEART: regular rate & rhythm and no murmurs and no lower extremity edema Musculoskeletal: no cyanosis of digits and no clubbing  PSYCH: alert & oriented x 3, fluent speech NEURO: no focal motor/sensory deficits  LABORATORY DATA:  I have reviewed the data as listed    Latest  Ref Rng & Units 05/26/2023    8:19 AM 11/23/2022    6:16 PM 09/17/2022    8:08 AM  CBC  WBC 4.0 - 10.5 K/uL 9.7   8.5   Hemoglobin 12.0 - 15.0 g/dL 6.7  78.2  95.6   Hematocrit 36.0 - 46.0 % 23.3  30.0  36.9   Platelets 150 - 400 K/uL 487   370        Latest Ref Rng & Units 11/23/2022    6:16 PM 09/17/2022    8:08 AM 06/16/2022    8:10 AM  CMP  Glucose 70 - 99 mg/dL 87  88  84   BUN 6 - 20 mg/dL 9  9  12   Creatinine 0.44 - 1.00 mg/dL 5.28  4.13  2.44   Sodium 135 - 145 mmol/L 140  138  137   Potassium 3.5 - 5.1 mmol/L 3.5  4.1  4.2   Chloride 98 - 111 mmol/L 106  106  109   CO2 22 - 32 mmol/L  26  23   Calcium 8.9 - 10.3 mg/dL  8.4  8.6   Total Protein 6.5 - 8.1 g/dL  7.6  7.1   Total Bilirubin 0.3 - 1.2 mg/dL  0.2  0.2   Alkaline Phos 38 - 126 U/L  42  36   AST 15 - 41 U/L  29  23   ALT 0 - 44 U/L  21  14     RADIOGRAPHIC STUDIES: No results found.  ASSESSMENT & PLAN Melissa Joyce is a 38 y.o. female with medical history significant for iron deficiency anemia 2/2 to GYN bleeding who presents for a follow up visit.  # Iron Deficiency Anemia 2/2 to GYN Bleeding -- Findings are consistent with iron deficiency anemia secondary to patient's menorrhagia --Last received 5 doses of IV Venofer but this did not adequately replete her iron stores.  Monoferric administered on 07/07/2022.  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  --Encouraged her to follow-up with OB/GYN for better control of her menstrual cycles.   --Continue iron polysaccharide 150 mg p.o. daily with a source of vitamin C --labs today show WBC 9.7, Hgb 8, MCV 6.7, Plt 487. Iron panel shows iron 11, TIBC 573, saturation 2%, ferritin pending.  --Recommend 1 unit of PRBC which is scheduled for tomorrow morning since patient is unable to stay today for the transfusion. In addition, we will arrange for IV iron (preferably IV monoferric) to bolster iron levels.  --Provided ER precautions if patient  develops worsening dizziness, syncope, shortness of breath before interventions above.  --Plan for return to clinic in 8 weeks with labs and follow up visit.   No orders of the defined types were placed in this encounter.   All questions were answered. The patient knows to call the clinic with any problems, questions or concerns.  A total of more than 30 minutes were spent on this encounter with face-to-face time and non-face-to-face time, including preparing to see the patient, ordering tests and/or medications, counseling the patient and coordination of care as outlined above.   Georga Kaufmann PA-C Dept of Hematology and Oncology [facility] at [facility] Phone: [phone]   05/26/2023 10:16 AM

## 2023-05-26 ENCOUNTER — Other Ambulatory Visit: Payer: Self-pay

## 2023-05-26 ENCOUNTER — Inpatient Hospital Stay (HOSPITAL_BASED_OUTPATIENT_CLINIC_OR_DEPARTMENT_OTHER): Payer: 59 | Admitting: Physician Assistant

## 2023-05-26 ENCOUNTER — Inpatient Hospital Stay: Payer: 59

## 2023-05-26 ENCOUNTER — Telehealth: Payer: Self-pay

## 2023-05-26 ENCOUNTER — Inpatient Hospital Stay: Payer: 59 | Attending: Physician Assistant

## 2023-05-26 VITALS — BP 137/73 | HR 77 | Temp 98.5°F | Resp 18 | Wt 233.3 lb

## 2023-05-26 DIAGNOSIS — Z825 Family history of asthma and other chronic lower respiratory diseases: Secondary | ICD-10-CM | POA: Diagnosis not present

## 2023-05-26 DIAGNOSIS — N920 Excessive and frequent menstruation with regular cycle: Secondary | ICD-10-CM | POA: Diagnosis not present

## 2023-05-26 DIAGNOSIS — D500 Iron deficiency anemia secondary to blood loss (chronic): Secondary | ICD-10-CM

## 2023-05-26 DIAGNOSIS — Z808 Family history of malignant neoplasm of other organs or systems: Secondary | ICD-10-CM | POA: Insufficient documentation

## 2023-05-26 DIAGNOSIS — Z91030 Bee allergy status: Secondary | ICD-10-CM | POA: Insufficient documentation

## 2023-05-26 DIAGNOSIS — Z832 Family history of diseases of the blood and blood-forming organs and certain disorders involving the immune mechanism: Secondary | ICD-10-CM | POA: Diagnosis not present

## 2023-05-26 DIAGNOSIS — Z8249 Family history of ischemic heart disease and other diseases of the circulatory system: Secondary | ICD-10-CM | POA: Diagnosis not present

## 2023-05-26 DIAGNOSIS — Z79899 Other long term (current) drug therapy: Secondary | ICD-10-CM | POA: Diagnosis not present

## 2023-05-26 DIAGNOSIS — D573 Sickle-cell trait: Secondary | ICD-10-CM | POA: Insufficient documentation

## 2023-05-26 DIAGNOSIS — R5383 Other fatigue: Secondary | ICD-10-CM | POA: Diagnosis not present

## 2023-05-26 DIAGNOSIS — Z823 Family history of stroke: Secondary | ICD-10-CM | POA: Diagnosis not present

## 2023-05-26 DIAGNOSIS — Z8049 Family history of malignant neoplasm of other genital organs: Secondary | ICD-10-CM | POA: Insufficient documentation

## 2023-05-26 DIAGNOSIS — Z793 Long term (current) use of hormonal contraceptives: Secondary | ICD-10-CM | POA: Diagnosis not present

## 2023-05-26 DIAGNOSIS — Z8041 Family history of malignant neoplasm of ovary: Secondary | ICD-10-CM | POA: Diagnosis not present

## 2023-05-26 LAB — PREPARE RBC (CROSSMATCH)

## 2023-05-26 LAB — CBC WITH DIFFERENTIAL (CANCER CENTER ONLY)
Abs Immature Granulocytes: 0.03 10*3/uL (ref 0.00–0.07)
Basophils Absolute: 0.1 10*3/uL (ref 0.0–0.1)
Basophils Relative: 1 %
Eosinophils Absolute: 0.2 10*3/uL (ref 0.0–0.5)
Eosinophils Relative: 2 %
HCT: 23.3 % — ABNORMAL LOW (ref 36.0–46.0)
Hemoglobin: 6.7 g/dL — CL (ref 12.0–15.0)
Immature Granulocytes: 0 %
Lymphocytes Relative: 27 %
Lymphs Abs: 2.6 10*3/uL (ref 0.7–4.0)
MCH: 16.4 pg — ABNORMAL LOW (ref 26.0–34.0)
MCHC: 28.8 g/dL — ABNORMAL LOW (ref 30.0–36.0)
MCV: 57 fL — ABNORMAL LOW (ref 80.0–100.0)
Monocytes Absolute: 0.7 10*3/uL (ref 0.1–1.0)
Monocytes Relative: 7 %
Neutro Abs: 6 10*3/uL (ref 1.7–7.7)
Neutrophils Relative %: 63 %
Platelet Count: 487 10*3/uL — ABNORMAL HIGH (ref 150–400)
RBC: 4.09 MIL/uL (ref 3.87–5.11)
RDW: 19.9 % — ABNORMAL HIGH (ref 11.5–15.5)
WBC Count: 9.7 10*3/uL (ref 4.0–10.5)
nRBC: 0.2 % (ref 0.0–0.2)

## 2023-05-26 LAB — FERRITIN: Ferritin: 2 ng/mL — ABNORMAL LOW (ref 11–307)

## 2023-05-26 LAB — IRON AND IRON BINDING CAPACITY (CC-WL,HP ONLY)
Iron: 11 ug/dL — ABNORMAL LOW (ref 28–170)
Saturation Ratios: 2 % — ABNORMAL LOW (ref 10.4–31.8)
TIBC: 573 ug/dL — ABNORMAL HIGH (ref 250–450)
UIBC: 562 ug/dL — ABNORMAL HIGH (ref 148–442)

## 2023-05-26 LAB — SAMPLE TO BLOOD BANK

## 2023-05-26 LAB — ABO/RH: ABO/RH(D): O POS

## 2023-05-26 NOTE — Telephone Encounter (Signed)
CRITICAL VALUE STICKER  CRITICAL VALUE:   hgb 6.7  RECEIVER (on-site recipient of call): Daneil Dolin, LPN  DATE & TIME NOTIFIED: 05/26/23  08:39  MESSENGER (representative from lab):  Pam  MD NOTIFIED: Georga Kaufmann, PA  TIME OF NOTIFICATION:08:40  RESPONSE:

## 2023-05-27 ENCOUNTER — Inpatient Hospital Stay: Payer: 59

## 2023-05-27 VITALS — BP 111/80 | HR 80 | Temp 98.0°F | Resp 16

## 2023-05-27 DIAGNOSIS — N920 Excessive and frequent menstruation with regular cycle: Secondary | ICD-10-CM | POA: Diagnosis not present

## 2023-05-27 DIAGNOSIS — Z8041 Family history of malignant neoplasm of ovary: Secondary | ICD-10-CM | POA: Diagnosis not present

## 2023-05-27 DIAGNOSIS — Z823 Family history of stroke: Secondary | ICD-10-CM | POA: Diagnosis not present

## 2023-05-27 DIAGNOSIS — Z832 Family history of diseases of the blood and blood-forming organs and certain disorders involving the immune mechanism: Secondary | ICD-10-CM | POA: Diagnosis not present

## 2023-05-27 DIAGNOSIS — D573 Sickle-cell trait: Secondary | ICD-10-CM | POA: Diagnosis not present

## 2023-05-27 DIAGNOSIS — D500 Iron deficiency anemia secondary to blood loss (chronic): Secondary | ICD-10-CM | POA: Diagnosis not present

## 2023-05-27 DIAGNOSIS — Z91030 Bee allergy status: Secondary | ICD-10-CM | POA: Diagnosis not present

## 2023-05-27 DIAGNOSIS — Z808 Family history of malignant neoplasm of other organs or systems: Secondary | ICD-10-CM | POA: Diagnosis not present

## 2023-05-27 DIAGNOSIS — R5383 Other fatigue: Secondary | ICD-10-CM | POA: Diagnosis not present

## 2023-05-27 DIAGNOSIS — Z8249 Family history of ischemic heart disease and other diseases of the circulatory system: Secondary | ICD-10-CM | POA: Diagnosis not present

## 2023-05-27 DIAGNOSIS — Z825 Family history of asthma and other chronic lower respiratory diseases: Secondary | ICD-10-CM | POA: Diagnosis not present

## 2023-05-27 DIAGNOSIS — Z79899 Other long term (current) drug therapy: Secondary | ICD-10-CM | POA: Diagnosis not present

## 2023-05-27 DIAGNOSIS — Z8049 Family history of malignant neoplasm of other genital organs: Secondary | ICD-10-CM | POA: Diagnosis not present

## 2023-05-27 DIAGNOSIS — Z793 Long term (current) use of hormonal contraceptives: Secondary | ICD-10-CM | POA: Diagnosis not present

## 2023-05-27 MED ORDER — DIPHENHYDRAMINE HCL 25 MG PO CAPS
25.0000 mg | ORAL_CAPSULE | Freq: Once | ORAL | Status: AC
  Administered 2023-05-27: 25 mg via ORAL
  Filled 2023-05-27: qty 1

## 2023-05-27 MED ORDER — SODIUM CHLORIDE 0.9 % IV SOLN: INTRAVENOUS | Status: DC

## 2023-05-27 MED ORDER — SODIUM CHLORIDE 0.9% IV SOLUTION: 250.0000 mL | INTRAVENOUS | Status: DC

## 2023-05-27 MED ORDER — SODIUM CHLORIDE 0.9 % IV SOLN
1000.0000 mg | Freq: Once | INTRAVENOUS | Status: AC
  Administered 2023-05-27: 1000 mg via INTRAVENOUS
  Filled 2023-05-27: qty 10

## 2023-05-27 MED ORDER — ACETAMINOPHEN 325 MG PO TABS
650.0000 mg | ORAL_TABLET | Freq: Once | ORAL | Status: AC
  Administered 2023-05-27: 650 mg via ORAL
  Filled 2023-05-27: qty 2

## 2023-05-28 LAB — TYPE AND SCREEN
ABO/RH(D): O POS
Antibody Screen: NEGATIVE
Unit division: 0

## 2023-05-28 LAB — BPAM RBC
Blood Product Expiration Date: 202502192359
ISSUE DATE / TIME: 202501221034
Unit Type and Rh: 202502192359
Unit Type and Rh: 5100

## 2023-06-08 ENCOUNTER — Encounter: Payer: Self-pay | Admitting: Physician Assistant

## 2023-06-22 ENCOUNTER — Other Ambulatory Visit: Payer: Self-pay | Admitting: Physician Assistant

## 2023-06-22 DIAGNOSIS — D500 Iron deficiency anemia secondary to blood loss (chronic): Secondary | ICD-10-CM

## 2023-06-23 ENCOUNTER — Inpatient Hospital Stay: Payer: Medicaid Other | Attending: Physician Assistant

## 2023-06-23 DIAGNOSIS — N920 Excessive and frequent menstruation with regular cycle: Secondary | ICD-10-CM | POA: Diagnosis not present

## 2023-06-23 DIAGNOSIS — D500 Iron deficiency anemia secondary to blood loss (chronic): Secondary | ICD-10-CM | POA: Diagnosis present

## 2023-06-23 DIAGNOSIS — Z79899 Other long term (current) drug therapy: Secondary | ICD-10-CM | POA: Insufficient documentation

## 2023-06-23 LAB — CBC WITH DIFFERENTIAL (CANCER CENTER ONLY)
Abs Immature Granulocytes: 0.04 10*3/uL (ref 0.00–0.07)
Basophils Absolute: 0.1 10*3/uL (ref 0.0–0.1)
Basophils Relative: 1 %
Eosinophils Absolute: 0.2 10*3/uL (ref 0.0–0.5)
Eosinophils Relative: 2 %
HCT: 32.7 % — ABNORMAL LOW (ref 36.0–46.0)
Hemoglobin: 10.7 g/dL — ABNORMAL LOW (ref 12.0–15.0)
Immature Granulocytes: 1 %
Lymphocytes Relative: 29 %
Lymphs Abs: 2.5 10*3/uL (ref 0.7–4.0)
MCH: 22.9 pg — ABNORMAL LOW (ref 26.0–34.0)
MCHC: 32.7 g/dL (ref 30.0–36.0)
MCV: 70 fL — ABNORMAL LOW (ref 80.0–100.0)
Monocytes Absolute: 0.6 10*3/uL (ref 0.1–1.0)
Monocytes Relative: 7 %
Neutro Abs: 5.2 10*3/uL (ref 1.7–7.7)
Neutrophils Relative %: 60 %
Platelet Count: 338 10*3/uL (ref 150–400)
RBC: 4.67 MIL/uL (ref 3.87–5.11)
WBC Count: 8.5 10*3/uL (ref 4.0–10.5)
nRBC: 0 % (ref 0.0–0.2)

## 2023-06-23 LAB — IRON AND IRON BINDING CAPACITY (CC-WL,HP ONLY)
Iron: 40 ug/dL (ref 28–170)
Saturation Ratios: 10 % — ABNORMAL LOW (ref 10.4–31.8)
TIBC: 395 ug/dL (ref 250–450)
UIBC: 355 ug/dL (ref 148–442)

## 2023-06-23 LAB — FERRITIN: Ferritin: 47 ng/mL (ref 11–307)

## 2023-07-20 ENCOUNTER — Other Ambulatory Visit: Payer: Self-pay | Admitting: Physician Assistant

## 2023-07-20 DIAGNOSIS — D500 Iron deficiency anemia secondary to blood loss (chronic): Secondary | ICD-10-CM

## 2023-07-21 ENCOUNTER — Encounter: Payer: Self-pay | Admitting: Physician Assistant

## 2023-07-21 ENCOUNTER — Inpatient Hospital Stay: Payer: 59 | Admitting: Physician Assistant

## 2023-07-21 ENCOUNTER — Inpatient Hospital Stay: Payer: 59 | Attending: Physician Assistant

## 2023-07-21 DIAGNOSIS — Z823 Family history of stroke: Secondary | ICD-10-CM | POA: Diagnosis not present

## 2023-07-21 DIAGNOSIS — E538 Deficiency of other specified B group vitamins: Secondary | ICD-10-CM | POA: Insufficient documentation

## 2023-07-21 DIAGNOSIS — G629 Polyneuropathy, unspecified: Secondary | ICD-10-CM | POA: Diagnosis not present

## 2023-07-21 DIAGNOSIS — N920 Excessive and frequent menstruation with regular cycle: Secondary | ICD-10-CM | POA: Diagnosis not present

## 2023-07-21 DIAGNOSIS — Z825 Family history of asthma and other chronic lower respiratory diseases: Secondary | ICD-10-CM | POA: Insufficient documentation

## 2023-07-21 DIAGNOSIS — Z91030 Bee allergy status: Secondary | ICD-10-CM | POA: Insufficient documentation

## 2023-07-21 DIAGNOSIS — Z793 Long term (current) use of hormonal contraceptives: Secondary | ICD-10-CM | POA: Diagnosis not present

## 2023-07-21 DIAGNOSIS — D500 Iron deficiency anemia secondary to blood loss (chronic): Secondary | ICD-10-CM | POA: Diagnosis not present

## 2023-07-21 DIAGNOSIS — Z79899 Other long term (current) drug therapy: Secondary | ICD-10-CM | POA: Insufficient documentation

## 2023-07-21 DIAGNOSIS — Z8041 Family history of malignant neoplasm of ovary: Secondary | ICD-10-CM | POA: Insufficient documentation

## 2023-07-21 DIAGNOSIS — D573 Sickle-cell trait: Secondary | ICD-10-CM | POA: Insufficient documentation

## 2023-07-21 DIAGNOSIS — Z98891 History of uterine scar from previous surgery: Secondary | ICD-10-CM | POA: Insufficient documentation

## 2023-07-21 DIAGNOSIS — Z832 Family history of diseases of the blood and blood-forming organs and certain disorders involving the immune mechanism: Secondary | ICD-10-CM | POA: Insufficient documentation

## 2023-07-21 DIAGNOSIS — Z808 Family history of malignant neoplasm of other organs or systems: Secondary | ICD-10-CM | POA: Diagnosis not present

## 2023-07-21 DIAGNOSIS — Z8249 Family history of ischemic heart disease and other diseases of the circulatory system: Secondary | ICD-10-CM | POA: Insufficient documentation

## 2023-07-21 DIAGNOSIS — Z8049 Family history of malignant neoplasm of other genital organs: Secondary | ICD-10-CM | POA: Diagnosis not present

## 2023-07-21 LAB — CBC WITH DIFFERENTIAL (CANCER CENTER ONLY)
Abs Immature Granulocytes: 0.03 10*3/uL (ref 0.00–0.07)
Basophils Absolute: 0.1 10*3/uL (ref 0.0–0.1)
Basophils Relative: 1 %
Eosinophils Absolute: 0.2 10*3/uL (ref 0.0–0.5)
Eosinophils Relative: 2 %
HCT: 32.4 % — ABNORMAL LOW (ref 36.0–46.0)
Hemoglobin: 10.8 g/dL — ABNORMAL LOW (ref 12.0–15.0)
Immature Granulocytes: 0 %
Lymphocytes Relative: 25 %
Lymphs Abs: 2.5 10*3/uL (ref 0.7–4.0)
MCH: 24.5 pg — ABNORMAL LOW (ref 26.0–34.0)
MCHC: 33.3 g/dL (ref 30.0–36.0)
MCV: 73.6 fL — ABNORMAL LOW (ref 80.0–100.0)
Monocytes Absolute: 0.6 10*3/uL (ref 0.1–1.0)
Monocytes Relative: 6 %
Neutro Abs: 6.8 10*3/uL (ref 1.7–7.7)
Neutrophils Relative %: 66 %
Platelet Count: 381 10*3/uL (ref 150–400)
RBC: 4.4 MIL/uL (ref 3.87–5.11)
RDW: 24.8 % — ABNORMAL HIGH (ref 11.5–15.5)
WBC Count: 10.2 10*3/uL (ref 4.0–10.5)
nRBC: 0 % (ref 0.0–0.2)

## 2023-07-21 LAB — IRON AND IRON BINDING CAPACITY (CC-WL,HP ONLY)
Iron: 22 ug/dL — ABNORMAL LOW (ref 28–170)
Saturation Ratios: 5 % — ABNORMAL LOW (ref 10.4–31.8)
TIBC: 442 ug/dL (ref 250–450)
UIBC: 420 ug/dL (ref 148–442)

## 2023-07-21 LAB — VITAMIN B12: Vitamin B-12: 263 pg/mL (ref 180–914)

## 2023-07-21 LAB — FERRITIN: Ferritin: 8 ng/mL — ABNORMAL LOW (ref 11–307)

## 2023-07-21 MED ORDER — VITAMIN B-12 1000 MCG PO TABS: 1000.0000 ug | ORAL_TABLET | Freq: Every day | ORAL | 3 refills | Status: AC

## 2023-07-21 NOTE — Progress Notes (Signed)
 [facility] [phone]   Fax:([phone]) [phone]  PROGRESS NOTE  I connected with Melissa Joyce  on 07/21/23 by telephone visit and verified that I am speaking with the correct person using two identifiers.   I discussed the limitations, risks, security and privacy concerns of performing an evaluation and management service by telemedicine and the availability of in-person appointments. I also discussed with the patient that there may be a patient responsible charge related to this service. The patient expressed understanding and agreed to proceed.  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 TIBC 498, Iron sat 4% 10/23/2021: establish care with Dr. Leonides Schanz  6/22-7/20/2023: IV iron sucrose 200 mg x 5 doses 12/13/2021: white blood cell count 8.6, hemoglobin 12.3, MCV 71.5, and platelets of 271.   06/16/2022:  white blood cell count 9.1, hemoglobin 9.7, MCV 67.1, and platelets of 359. 07/07/2022: Monoferric 1000 mg IV administered.   Interval History:  Melissa Joyce 38 y.o. female with medical history significant for iron deficiency anemia 2/2 to GYN bleeding who presents for a follow up visit. The patient's last visit was on 09/17/2022.   On exam today Melissa Joyce reports mild improvement with fatigue after IV iron infusion earlier this month but now is starting to decline. She reports having persistently heavy menstrual cycle and is looking to establish with a new OB/GYN provider. She reports that she has started back on iron pills and tolerating well. She does experience some dizziness during her menstrual cycles. She is craving  ice but not flour. She has some numbness in her toes.  She is not having any fevers, chills, sweats, nausea, or diarrhea.  Full 10 point ROS is listed below.  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   Postpartum care following cesarean delivery (09/22/12) 09/23/2012   Pregnant    S/P repeat low transverse C-section 09/23/2012   Sickle cell trait (HCC)     SURGICAL HISTORY: Past Surgical History:  Procedure Laterality Date   CESAREAN SECTION  2005   CESAREAN SECTION N/A 09/22/2012   Procedure: CESAREAN SECTION;  Surgeon: Serita Kyle, MD;  Location: [facility];  Service: Obstetrics;  Laterality: N/A;   CESAREAN SECTION N/A 12/03/2019   Procedure: REPEAT CESAREAN SECTION ;  Surgeon: Shea Evans, MD;  Location: [facility];  Service: Obstetrics;  Laterality: N/A;    SOCIAL HISTORY: Social History   Socioeconomic History   Marital status: Single    Spouse name: Not on file   Number of children: Not on file   Years of education: Not on file   Highest education level: Not on file  Occupational History   Not on file  Tobacco Use   Smoking status: Never   Smokeless tobacco: Never  Vaping Use   Vaping status: Never Used  Substance and Sexual Activity   Alcohol use: Not Currently    Comment: wine on ocassion   Drug use: No   Sexual activity: Yes  Other Topics Concern   Not on file  Social History Narrative   Not on file   Social Drivers of Health  Financial Resource Strain: Not on file  Food Insecurity: Not on file  Transportation Needs: Not on file  Physical Activity: Not on file  Stress: Not on file  Social Connections: Not on file  Intimate Partner Violence: Not on file    FAMILY HISTORY: Family History  Problem Relation Age of Onset   Anemia Mother    Cervical cancer Mother    Heart disease Father    Asthma Sister    Asthma Brother    Cervical cancer Maternal Grandmother     Skin cancer Maternal Grandmother    Stroke Paternal Grandmother    Hypertension Paternal Grandmother    Stroke Paternal Grandfather    Hypertension Paternal Grandfather    Asthma Daughter    Ovarian cancer Other    Other Neg Hx    Sleep apnea Neg Hx     ALLERGIES:  is allergic to bee venom and shrimp [shellfish allergy].  MEDICATIONS:  Current Outpatient Medications  Medication Sig Dispense Refill   cyclobenzaprine (FLEXERIL) 10 MG tablet Take 1 tablet (10 mg total) by mouth 2 (two) times daily as needed for up to 20 doses for muscle spasms. 20 tablet 0   etonogestrel-ethinyl estradiol (NUVARING) 0.12-0.015 MG/24HR vaginal ring NuvaRing     No current facility-administered medications for this visit.    REVIEW OF SYSTEMS:   Constitutional: ( - ) fevers, ( - )  chills , ( - ) night sweats Eyes: ( - ) blurriness of vision, ( - ) double vision, ( - ) watery eyes Ears, nose, mouth, throat, and face: ( - ) mucositis, ( - ) sore throat Respiratory: ( - ) cough, ( - ) dyspnea, ( - ) wheezes Cardiovascular: ( - ) palpitation, ( - ) chest discomfort, ( - ) lower extremity swelling Gastrointestinal:  ( - ) nausea, ( - ) heartburn, ( - ) change in bowel habits Skin: ( - ) abnormal skin rashes Lymphatics: ( - ) new lymphadenopathy, ( - ) easy bruising Neurological: ( - ) numbness, ( - ) tingling, ( - ) new weaknesses Behavioral/Psych: ( - ) mood change, ( - ) new changes  All other systems were reviewed with the patient and are negative.  PHYSICAL EXAMINATION: Not performed due to virtual visit.   LABORATORY DATA:  I have reviewed the data as listed    Latest Ref Rng & Units 07/21/2023    8:29 AM 06/23/2023    9:01 AM 05/26/2023    8:19 AM  CBC  WBC 4.0 - 10.5 K/uL 10.2  8.5  9.7   Hemoglobin 12.0 - 15.0 g/dL 40.9  81.1  6.7   Hematocrit 36.0 - 46.0 % 32.4  32.7  23.3   Platelets 150 - 400 K/uL 381  338  487        Latest Ref Rng & Units 11/23/2022    6:16 PM 09/17/2022    8:08  AM 06/16/2022    8:10 AM  CMP  Glucose 70 - 99 mg/dL 87  88  84   BUN 6 - 20 mg/dL 9  9  12    Creatinine 0.44 - 1.00 mg/dL 9.14  7.82  9.56   Sodium 135 - 145 mmol/L 140  138  137   Potassium 3.5 - 5.1 mmol/L 3.5  4.1  4.2   Chloride 98 - 111 mmol/L 106  106  109   CO2 22 - 32 mmol/L  26  23   Calcium 8.9 - 10.3 mg/dL  8.4  8.6   Total Protein 6.5 - 8.1 g/dL  7.6  7.1   Total Bilirubin 0.3 - 1.2 mg/dL  0.2  0.2   Alkaline Phos 38 - 126 U/L  42  36   AST 15 - 41 U/L  29  23   ALT 0 - 44 U/L  21  14     RADIOGRAPHIC STUDIES: No results found.  ASSESSMENT & PLAN Melissa Joyce is a 38 y.o. female with medical history significant for iron deficiency anemia 2/2 to GYN bleeding who presents for a follow up visit.  # Iron Deficiency Anemia 2/2 to GYN Bleeding -- Findings are consistent with iron deficiency anemia secondary to patient's menorrhagia --Last received IV monoferric 1000 mg x 1 dose on 05/27/2023.  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  --Encouraged her to follow-up with OB/GYN for better control of her menstrual cycles. Referral sent.   --Continue iron polysaccharide 150 mg p.o. daily with a source of vitamin C --labs today show improved anemia with Hgb 10.8, MCV 73.6. Iron panel shows deficiency with iron 22, TIBC 442, saturation 5%, ferritin 8.  --Recommend another round of IV monoferric 1000 mg x 1 dose --Plan for return to clinic in 3 months for labs only and 6 months for labs/follow up.   #B12 deficiency: #Neuropathy in toes: --Vitamin B12 level is 263. Recommend vitamin B12 1000 mcg PO daily.   No orders of the defined types were placed in this encounter.   All questions were answered. The patient knows to call the clinic with any problems, questions or concerns.  A total of more than 25 minutes were spent on this encounter with non-face-to-face time, including preparing to see the patient, ordering tests and/or medications,  counseling the patient and coordination of care as outlined above.   Georga Kaufmann PA-C Dept of Hematology and Oncology [facility] at [facility] Phone: [phone]   07/21/2023 11:33 AM

## 2023-07-22 ENCOUNTER — Telehealth: Payer: Self-pay | Admitting: Physician Assistant

## 2023-07-22 ENCOUNTER — Encounter: Payer: Self-pay | Admitting: Physician Assistant

## 2023-07-28 ENCOUNTER — Ambulatory Visit

## 2023-07-28 ENCOUNTER — Inpatient Hospital Stay

## 2023-07-28 VITALS — BP 132/68 | HR 77 | Temp 98.0°F | Resp 16

## 2023-07-28 DIAGNOSIS — D500 Iron deficiency anemia secondary to blood loss (chronic): Secondary | ICD-10-CM

## 2023-07-28 MED ORDER — SODIUM CHLORIDE 0.9 % IV SOLN: INTRAVENOUS | Status: DC

## 2023-07-28 MED ORDER — CETIRIZINE HCL 10 MG/ML IV SOLN
10.0000 mg | Freq: Once | INTRAVENOUS | Status: AC
  Administered 2023-07-28: 10 mg via INTRAVENOUS
  Filled 2023-07-28: qty 1

## 2023-07-28 MED ORDER — SODIUM CHLORIDE 0.9 % IV SOLN
1000.0000 mg | Freq: Once | INTRAVENOUS | Status: AC
  Administered 2023-07-28: 1000 mg via INTRAVENOUS
  Filled 2023-07-28: qty 10

## 2023-07-28 NOTE — Progress Notes (Signed)
 Patient declined to stay for 30 minute post-observation period following Monoferric infusion.  Patient educated on monitoring for s/s of adverse reactions. Patient's vital signs retaken prior to discharge and remained within normal parameters.  Patient discharged in stable condition.

## 2023-07-28 NOTE — Patient Instructions (Signed)

## 2023-07-29 ENCOUNTER — Ambulatory Visit

## 2023-08-12 ENCOUNTER — Encounter: Payer: Self-pay | Admitting: Physician Assistant

## 2023-08-20 ENCOUNTER — Encounter: Payer: Self-pay | Admitting: Physician Assistant

## 2023-10-16 ENCOUNTER — Encounter: Admitting: Family

## 2023-10-16 NOTE — Progress Notes (Signed)
 Erroneous encounter-disregard

## 2023-10-22 ENCOUNTER — Ambulatory Visit: Payer: Self-pay | Admitting: Physician Assistant

## 2023-10-22 ENCOUNTER — Other Ambulatory Visit: Payer: Self-pay | Admitting: *Deleted

## 2023-10-22 ENCOUNTER — Other Ambulatory Visit: Payer: Self-pay | Admitting: Physician Assistant

## 2023-10-22 ENCOUNTER — Inpatient Hospital Stay: Attending: Physician Assistant

## 2023-10-22 DIAGNOSIS — D500 Iron deficiency anemia secondary to blood loss (chronic): Secondary | ICD-10-CM

## 2023-10-22 DIAGNOSIS — D509 Iron deficiency anemia, unspecified: Secondary | ICD-10-CM | POA: Diagnosis present

## 2023-10-22 DIAGNOSIS — Z79899 Other long term (current) drug therapy: Secondary | ICD-10-CM | POA: Insufficient documentation

## 2023-10-22 DIAGNOSIS — E538 Deficiency of other specified B group vitamins: Secondary | ICD-10-CM

## 2023-10-22 LAB — CBC WITH DIFFERENTIAL (CANCER CENTER ONLY)
Abs Immature Granulocytes: 0.02 10*3/uL (ref 0.00–0.07)
Basophils Absolute: 0.1 10*3/uL (ref 0.0–0.1)
Basophils Relative: 1 %
Eosinophils Absolute: 0.2 10*3/uL (ref 0.0–0.5)
Eosinophils Relative: 2 %
HCT: 34.1 % — ABNORMAL LOW (ref 36.0–46.0)
Hemoglobin: 11.8 g/dL — ABNORMAL LOW (ref 12.0–15.0)
Immature Granulocytes: 0 %
Lymphocytes Relative: 25 %
Lymphs Abs: 2.3 10*3/uL (ref 0.7–4.0)
MCH: 25.2 pg — ABNORMAL LOW (ref 26.0–34.0)
MCHC: 34.6 g/dL (ref 30.0–36.0)
MCV: 72.9 fL — ABNORMAL LOW (ref 80.0–100.0)
Monocytes Absolute: 0.6 10*3/uL (ref 0.1–1.0)
Monocytes Relative: 6 %
Neutro Abs: 6.1 10*3/uL (ref 1.7–7.7)
Neutrophils Relative %: 66 %
Platelet Count: 345 10*3/uL (ref 150–400)
RBC: 4.68 MIL/uL (ref 3.87–5.11)
RDW: 16.4 % — ABNORMAL HIGH (ref 11.5–15.5)
WBC Count: 9.2 10*3/uL (ref 4.0–10.5)
nRBC: 0 % (ref 0.0–0.2)

## 2023-10-22 LAB — IRON AND IRON BINDING CAPACITY (CC-WL,HP ONLY)
Iron: 49 ug/dL (ref 28–170)
Saturation Ratios: 11 % (ref 10.4–31.8)
TIBC: 451 ug/dL — ABNORMAL HIGH (ref 250–450)
UIBC: 402 ug/dL (ref 148–442)

## 2023-10-22 LAB — VITAMIN B12: Vitamin B-12: 311 pg/mL (ref 180–914)

## 2023-10-22 LAB — FERRITIN: Ferritin: 15 ng/mL (ref 11–307)

## 2023-10-23 ENCOUNTER — Telehealth: Payer: Self-pay | Admitting: Physician Assistant

## 2023-10-23 NOTE — Telephone Encounter (Signed)
 Scheduled appointment per 6/20 secure chat. Called and left a VM with appointment details.

## 2023-10-27 ENCOUNTER — Other Ambulatory Visit

## 2023-10-29 ENCOUNTER — Inpatient Hospital Stay

## 2023-10-30 ENCOUNTER — Telehealth: Payer: Self-pay | Admitting: Physician Assistant

## 2023-10-30 NOTE — Telephone Encounter (Signed)
 Rescheduled appointment per secure chat. Talked with the patient and she is aware of the made appointments.

## 2023-11-09 ENCOUNTER — Inpatient Hospital Stay: Attending: Physician Assistant

## 2023-11-09 DIAGNOSIS — D509 Iron deficiency anemia, unspecified: Secondary | ICD-10-CM | POA: Diagnosis present

## 2023-11-09 DIAGNOSIS — Z79899 Other long term (current) drug therapy: Secondary | ICD-10-CM | POA: Diagnosis not present

## 2023-11-09 DIAGNOSIS — D500 Iron deficiency anemia secondary to blood loss (chronic): Secondary | ICD-10-CM

## 2023-11-09 MED ORDER — CETIRIZINE HCL 10 MG/ML IV SOLN
10.0000 mg | Freq: Once | INTRAVENOUS | Status: AC
  Administered 2023-11-09: 10 mg via INTRAVENOUS
  Filled 2023-11-09: qty 1

## 2023-11-09 MED ORDER — SODIUM CHLORIDE 0.9 % IV SOLN: INTRAVENOUS | Status: DC

## 2023-11-09 MED ORDER — SODIUM CHLORIDE 0.9 % IV SOLN
1000.0000 mg | Freq: Once | INTRAVENOUS | Status: AC
  Administered 2023-11-09: 1000 mg via INTRAVENOUS
  Filled 2023-11-09: qty 10

## 2023-11-09 NOTE — Progress Notes (Signed)
 Pt. declines to stay for 30 minute post observation. Sates she has been tolerating treatment without any issues. Vital signs stable, left via ambulation, and no respiratory distress noted.

## 2023-11-09 NOTE — Patient Instructions (Signed)

## 2024-01-25 ENCOUNTER — Other Ambulatory Visit: Payer: Self-pay | Admitting: Hematology and Oncology

## 2024-01-25 DIAGNOSIS — E538 Deficiency of other specified B group vitamins: Secondary | ICD-10-CM

## 2024-01-25 DIAGNOSIS — D500 Iron deficiency anemia secondary to blood loss (chronic): Secondary | ICD-10-CM

## 2024-01-25 NOTE — Progress Notes (Unsigned)
 [facility] [phone]   [phone]) [phone]  PROGRESS NOTE  Patient Care Team: Rosalea Rosina SAILOR, PA as PCP - General (Physician Assistant) Lorence Ozell CROME, MD (Inactive) as Consulting Physician (Obstetrics and Gynecology)  Hematological/Oncological History # Iron  Deficiency Anemia 2/2 to GYN Bleeding 12/11/2019: WBC 9.1, Hgb 11.4, MCV 84.5, Plt 296 09/17/2021: WBC 9.8, Hgb 9.8, MCV 70, Plt 442. TIBC 498, Iron  sat 4% 10/23/2021: establish care with Dr. Federico  6/22-7/20/2023: IV iron  sucrose 200 mg x 5 doses 12/13/2021: white blood cell count 8.6, hemoglobin 12.3, MCV 71.5, and platelets of 271.   06/16/2022:  white blood cell count 9.1, hemoglobin 9.7, MCV 67.1, and platelets of 359. 07/07/2022: Monoferric  1000 mg IV administered.   Interval History:  Melissa Joyce 38 y.o. female with medical history significant for iron  deficiency anemia 2/2 to GYN bleeding who presents for a follow up visit. The patient's last visit was on 07/21/2023.  On exam today Melissa Joyce reports having fatigue that is better than before but still persistent.  She reports her menstrual cycles have improved with only lasting 5 days with 4 days of heavy bleeding.  She continues to be on birth control with the NuvaRing.  She denies nausea, vomiting or bowel habit changes.  She has no other signs of bleeding such as hematochezia or melena.  Patient has occasional dizziness when she is doing certain things like eating.  She has no syncopal episodes.  Patient denies fevers, chills, night sweats, shortness of breath, chest pain or cough.  She has no other complaints.  Rest of the 10 point ROS as below.   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   Postpartum care following cesarean delivery (09/22/12) 09/23/2012   Pregnant    S/P repeat low transverse C-section 09/23/2012   Sickle cell trait     SURGICAL HISTORY: Past  Surgical History:  Procedure Laterality Date   CESAREAN SECTION  2005   CESAREAN SECTION N/A 09/22/2012   Procedure: CESAREAN SECTION;  Surgeon: Dickie DELENA Carder, MD;  Location: [facility];  Service: Obstetrics;  Laterality: N/A;   CESAREAN SECTION N/A 12/03/2019   Procedure: REPEAT CESAREAN SECTION ;  Surgeon: Barbette Knock, MD;  Location: [facility];  Service: Obstetrics;  Laterality: N/A;    SOCIAL HISTORY: Social History   Socioeconomic History   Marital status: Single    Spouse name: Not on file   Number of children: Not on file   Years of education: Not on file   Highest education level: Not on file  Occupational History   Not on file  Tobacco Use   Smoking status: Never   Smokeless tobacco: Never  Vaping Use   Vaping status: Never Used  Substance and Sexual Activity   Alcohol use: Not Currently    Comment: wine on ocassion   Drug use: No   Sexual activity: Yes  Other Topics Concern   Not on file  Social History Narrative   Not on file   Social Drivers of Health   Financial Resource Strain: Not on file  Food Insecurity: Not on file  Transportation Needs: Not on file  Physical Activity: Not on file  Stress: Not on file  Social Connections: Not on file  Intimate Partner Violence: Not on file    FAMILY HISTORY: Family History  Problem Relation Age of Onset   Anemia Mother    Cervical cancer  Mother    Heart disease Father    Asthma Sister    Asthma Brother    Cervical cancer Maternal Grandmother    Skin cancer Maternal Grandmother    Stroke Paternal Grandmother    Hypertension Paternal Grandmother    Stroke Paternal Grandfather    Hypertension Paternal Grandfather    Asthma Daughter    Ovarian cancer Other    Other Neg Hx    Sleep apnea Neg Hx     ALLERGIES:  is allergic to bee venom and shrimp [shellfish allergy].  MEDICATIONS:  Current Outpatient Medications  Medication Sig Dispense Refill   cyanocobalamin  (VITAMIN B12) 1000 MCG tablet Take 1  tablet (1,000 mcg total) by mouth daily. 30 tablet 3   cyclobenzaprine  (FLEXERIL ) 10 MG tablet Take 1 tablet (10 mg total) by mouth 2 (two) times daily as needed for up to 20 doses for muscle spasms. 20 tablet 0   etonogestrel -ethinyl estradiol  (NUVARING) 0.12-0.015 MG/24HR vaginal ring NuvaRing     No current facility-administered medications for this visit.    REVIEW OF SYSTEMS:   Constitutional: ( - ) fevers, ( - )  chills , ( - ) night sweats Eyes: ( - ) blurriness of vision, ( - ) double vision, ( - ) watery eyes Ears, nose, mouth, throat, and face: ( - ) mucositis, ( - ) sore throat Respiratory: ( - ) cough, ( - ) dyspnea, ( - ) wheezes Cardiovascular: ( - ) palpitation, ( - ) chest discomfort, ( - ) lower extremity swelling Gastrointestinal:  ( - ) nausea, ( - ) heartburn, ( - ) change in bowel habits Skin: ( - ) abnormal skin rashes Lymphatics: ( - ) new lymphadenopathy, ( - ) easy bruising Neurological: ( - ) numbness, ( - ) tingling, ( - ) new weaknesses Behavioral/Psych: ( - ) mood change, ( - ) new changes  All other systems were reviewed with the patient and are negative.  PHYSICAL EXAMINATION: Vitals:   01/26/24 0841  BP: 129/72  Pulse: 69  Resp: 13  Temp: (!) 97.5 F (36.4 C)  SpO2: 100%    GENERAL: Well-appearing middle-aged African-American female, alert, no distress and comfortable SKIN: skin color, texture, turgor are normal, no rashes or significant lesions EYES: conjunctiva are pink and non-injected, sclera clear,  LUNGS: clear to auscultation and percussion with normal breathing effort HEART: regular rate & rhythm and no murmurs and no lower extremity edema Musculoskeletal: no cyanosis of digits and no clubbing  PSYCH: alert & oriented x 3, fluent speech NEURO: no focal motor/sensory deficits  LABORATORY DATA:  I have reviewed the data as listed    Latest Ref Rng & Units 10/22/2023    8:57 AM 07/21/2023    8:29 AM 06/23/2023    9:01 AM  CBC  WBC 4.0  - 10.5 K/uL 9.2  10.2  8.5   Hemoglobin 12.0 - 15.0 g/dL 88.1  89.1  89.2   Hematocrit 36.0 - 46.0 % 34.1  32.4  32.7   Platelets 150 - 400 K/uL 345  381  338        Latest Ref Rng & Units 11/23/2022    6:16 PM 09/17/2022    8:08 AM 06/16/2022    8:10 AM  CMP  Glucose 70 - 99 mg/dL 87  88  84   BUN 6 - 20 mg/dL 9  9  12    Creatinine 0.44 - 1.00 mg/dL 9.09  9.18  9.17   Sodium 135 -  145 mmol/L 140  138  137   Potassium 3.5 - 5.1 mmol/L 3.5  4.1  4.2   Chloride 98 - 111 mmol/L 106  106  109   CO2 22 - 32 mmol/L  26  23   Calcium 8.9 - 10.3 mg/dL  8.4  8.6   Total Protein 6.5 - 8.1 g/dL  7.6  7.1   Total Bilirubin 0.3 - 1.2 mg/dL  0.2  0.2   Alkaline Phos 38 - 126 U/L  42  36   AST 15 - 41 U/L  29  23   ALT 0 - 44 U/L  21  14     RADIOGRAPHIC STUDIES: No results found.  ASSESSMENT & PLAN Melissa Joyce is a 38 y.o. female with medical history significant for iron  deficiency anemia 2/2 to GYN bleeding who presents for a follow up visit.  # Iron  Deficiency Anemia 2/2 to GYN Bleeding -- Findings are consistent with iron  deficiency anemia secondary to patient's menorrhagia --Last received IV monoferric  1000 mg x 1 dose on 11/09/2023.  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  --Encouraged her to follow-up with OB/GYN for better control of her menstrual cycles. Referral sent.   --Continue iron  polysaccharide 150 mg p.o. daily with a source of vitamin C --labs today show mild anemia with hemoglobin 11.4, MCV 74.4.  Iron  panel pending. --Will arrange for IV iron  based on pending iron  levels. --Plan for return to clinic in 3 months for labs only and 6 months for labs/follow up.   #B12 deficiency: #Neuropathy in toes: -- Patient is currently not taking vitamin B12 supplementation.  She did request that if today's labs show persistent vitamin B12 deficiency, she would like to proceed B12 injections instead.  No orders of the defined types were placed in this  encounter.   All questions were answered. The patient knows to call the clinic with any problems, questions or concerns.  A total of more than 25 minutes were spent on this encounter with face-to-face time, non-face-to-face time, including preparing to see the patient, performing a physical exam, ordering tests and/or medications, counseling the patient and coordination of care as outlined above.   Johnston Police PA-C Dept of Hematology and Oncology [facility] Phone: [phone]   01/25/2024 10:23 PM

## 2024-01-26 ENCOUNTER — Inpatient Hospital Stay: Attending: Physician Assistant

## 2024-01-26 ENCOUNTER — Inpatient Hospital Stay (HOSPITAL_BASED_OUTPATIENT_CLINIC_OR_DEPARTMENT_OTHER): Admitting: Physician Assistant

## 2024-01-26 ENCOUNTER — Other Ambulatory Visit

## 2024-01-26 ENCOUNTER — Ambulatory Visit: Admitting: Physician Assistant

## 2024-01-26 VITALS — BP 129/72 | HR 69 | Temp 97.5°F | Resp 13 | Wt 236.6 lb

## 2024-01-26 DIAGNOSIS — R5383 Other fatigue: Secondary | ICD-10-CM | POA: Diagnosis not present

## 2024-01-26 DIAGNOSIS — Z79899 Other long term (current) drug therapy: Secondary | ICD-10-CM | POA: Diagnosis not present

## 2024-01-26 DIAGNOSIS — Z8049 Family history of malignant neoplasm of other genital organs: Secondary | ICD-10-CM | POA: Insufficient documentation

## 2024-01-26 DIAGNOSIS — Z8249 Family history of ischemic heart disease and other diseases of the circulatory system: Secondary | ICD-10-CM | POA: Insufficient documentation

## 2024-01-26 DIAGNOSIS — Z8041 Family history of malignant neoplasm of ovary: Secondary | ICD-10-CM | POA: Insufficient documentation

## 2024-01-26 DIAGNOSIS — E538 Deficiency of other specified B group vitamins: Secondary | ICD-10-CM | POA: Insufficient documentation

## 2024-01-26 DIAGNOSIS — Z793 Long term (current) use of hormonal contraceptives: Secondary | ICD-10-CM | POA: Diagnosis not present

## 2024-01-26 DIAGNOSIS — Z91030 Bee allergy status: Secondary | ICD-10-CM | POA: Diagnosis not present

## 2024-01-26 DIAGNOSIS — Z825 Family history of asthma and other chronic lower respiratory diseases: Secondary | ICD-10-CM | POA: Diagnosis not present

## 2024-01-26 DIAGNOSIS — Z808 Family history of malignant neoplasm of other organs or systems: Secondary | ICD-10-CM | POA: Diagnosis not present

## 2024-01-26 DIAGNOSIS — D509 Iron deficiency anemia, unspecified: Secondary | ICD-10-CM | POA: Diagnosis present

## 2024-01-26 DIAGNOSIS — Z98891 History of uterine scar from previous surgery: Secondary | ICD-10-CM | POA: Insufficient documentation

## 2024-01-26 DIAGNOSIS — D573 Sickle-cell trait: Secondary | ICD-10-CM | POA: Diagnosis not present

## 2024-01-26 DIAGNOSIS — G629 Polyneuropathy, unspecified: Secondary | ICD-10-CM | POA: Insufficient documentation

## 2024-01-26 DIAGNOSIS — D500 Iron deficiency anemia secondary to blood loss (chronic): Secondary | ICD-10-CM

## 2024-01-26 DIAGNOSIS — N920 Excessive and frequent menstruation with regular cycle: Secondary | ICD-10-CM | POA: Diagnosis not present

## 2024-01-26 DIAGNOSIS — Z823 Family history of stroke: Secondary | ICD-10-CM | POA: Diagnosis not present

## 2024-01-26 LAB — CBC WITH DIFFERENTIAL (CANCER CENTER ONLY)
Abs Immature Granulocytes: 0.02 K/uL (ref 0.00–0.07)
Basophils Absolute: 0.1 K/uL (ref 0.0–0.1)
Basophils Relative: 1 %
Eosinophils Absolute: 0.2 K/uL (ref 0.0–0.5)
Eosinophils Relative: 2 %
HCT: 32.8 % — ABNORMAL LOW (ref 36.0–46.0)
Hemoglobin: 11.4 g/dL — ABNORMAL LOW (ref 12.0–15.0)
Immature Granulocytes: 0 %
Lymphocytes Relative: 24 %
Lymphs Abs: 2.2 K/uL (ref 0.7–4.0)
MCH: 25.9 pg — ABNORMAL LOW (ref 26.0–34.0)
MCHC: 34.8 g/dL (ref 30.0–36.0)
MCV: 74.4 fL — ABNORMAL LOW (ref 80.0–100.0)
Monocytes Absolute: 0.6 K/uL (ref 0.1–1.0)
Monocytes Relative: 6 %
Neutro Abs: 6.4 K/uL (ref 1.7–7.7)
Neutrophils Relative %: 67 %
Platelet Count: 378 K/uL (ref 150–400)
RBC: 4.41 MIL/uL (ref 3.87–5.11)
RDW: 16.6 % — ABNORMAL HIGH (ref 11.5–15.5)
WBC Count: 9.5 K/uL (ref 4.0–10.5)
nRBC: 0 % (ref 0.0–0.2)

## 2024-01-26 LAB — RETIC PANEL
Immature Retic Fract: 31.9 % — ABNORMAL HIGH (ref 2.3–15.9)
RBC.: 4.33 MIL/uL (ref 3.87–5.11)
Retic Count, Absolute: 73.6 K/uL (ref 19.0–186.0)
Retic Ct Pct: 1.7 % (ref 0.4–3.1)
Reticulocyte Hemoglobin: 28.5 pg (ref >27.9)

## 2024-01-26 LAB — CMP (CANCER CENTER ONLY)
ALT: 15 U/L (ref 0–44)
AST: 17 U/L (ref 15–41)
Albumin: 3.9 g/dL (ref 3.5–5.0)
Alkaline Phosphatase: 44 U/L (ref 38–126)
Anion gap: 5 (ref 5–15)
BUN: 10 mg/dL (ref 6–20)
CO2: 29 mmol/L (ref 22–32)
Calcium: 9.1 mg/dL (ref 8.9–10.3)
Chloride: 105 mmol/L (ref 98–111)
Creatinine: 0.81 mg/dL (ref 0.44–1.00)
GFR, Estimated: >60 mL/min (ref >60)
Glucose, Bld: 82 mg/dL (ref 70–99)
Potassium: 4.2 mmol/L (ref 3.5–5.1)
Sodium: 139 mmol/L (ref 135–145)
Total Bilirubin: 0.4 mg/dL (ref 0.0–1.2)
Total Protein: 7.4 g/dL (ref 6.5–8.1)

## 2024-01-26 LAB — VITAMIN B12: Vitamin B-12: 412 pg/mL (ref 180–914)

## 2024-01-26 LAB — IRON AND IRON BINDING CAPACITY (CC-WL,HP ONLY)
Iron: 39 ug/dL (ref 28–170)
Saturation Ratios: 9 % — ABNORMAL LOW (ref 10.4–31.8)
TIBC: 420 ug/dL (ref 250–450)
UIBC: 381 ug/dL (ref 148–442)

## 2024-01-26 LAB — FERRITIN: Ferritin: 30 ng/mL (ref 11–307)

## 2024-02-01 ENCOUNTER — Encounter: Payer: Self-pay | Admitting: Physician Assistant

## 2024-02-04 ENCOUNTER — Ambulatory Visit: Payer: Self-pay | Admitting: Physician Assistant

## 2024-02-04 ENCOUNTER — Telehealth (HOSPITAL_COMMUNITY): Payer: Self-pay | Admitting: Pharmacy Technician

## 2024-02-04 ENCOUNTER — Other Ambulatory Visit (HOSPITAL_COMMUNITY): Payer: Self-pay | Admitting: Pharmacy Technician

## 2024-02-04 ENCOUNTER — Other Ambulatory Visit: Payer: Self-pay | Admitting: Physician Assistant

## 2024-02-04 NOTE — Telephone Encounter (Signed)
 Auth Submission: NO AUTH NEEDED Site of care: [facility] Payer: [place] [employer] Medication & CPT/J Code(s) submitted: Monoferric  (Ferrci derisomaltose) [phone] Diagnosis Code: D50.0 Route of submission (phone, fax, portal):  Phone # Fax # Auth type: Buy/Bill HB Units/visits requested: 1000MG  X 1 DOSE Reference number:  Approval from: 02/04/24 to 05/04/24      Dagoberto Armour, CPhT [facility] Phone: [phone] 02/04/2024

## 2024-02-25 ENCOUNTER — Ambulatory Visit (HOSPITAL_COMMUNITY)
Admission: RE | Admit: 2024-02-25 | Discharge: 2024-02-25 | Disposition: A | Discharge status: Home / Self Care | Source: Ambulatory Visit | Attending: Physician Assistant | Admitting: Physician Assistant

## 2024-02-25 VITALS — BP 120/67 | HR 70 | Temp 98.3°F | Resp 16

## 2024-02-25 DIAGNOSIS — D500 Iron deficiency anemia secondary to blood loss (chronic): Secondary | ICD-10-CM | POA: Diagnosis present

## 2024-02-25 MED ORDER — SODIUM CHLORIDE 0.9 % IV SOLN
1000.0000 mg | Freq: Once | INTRAVENOUS | Status: AC
  Administered 2024-02-25: 1000 mg via INTRAVENOUS
  Filled 2024-02-25: qty 10

## 2024-03-30 ENCOUNTER — Other Ambulatory Visit: Payer: Self-pay

## 2024-03-30 ENCOUNTER — Other Ambulatory Visit: Payer: Self-pay | Admitting: Physician Assistant

## 2024-03-30 DIAGNOSIS — D500 Iron deficiency anemia secondary to blood loss (chronic): Secondary | ICD-10-CM

## 2024-04-04 ENCOUNTER — Inpatient Hospital Stay: Attending: Physician Assistant

## 2024-04-04 DIAGNOSIS — D509 Iron deficiency anemia, unspecified: Secondary | ICD-10-CM | POA: Insufficient documentation

## 2024-04-04 DIAGNOSIS — Z79899 Other long term (current) drug therapy: Secondary | ICD-10-CM | POA: Insufficient documentation

## 2024-04-04 DIAGNOSIS — D500 Iron deficiency anemia secondary to blood loss (chronic): Secondary | ICD-10-CM

## 2024-04-04 LAB — CBC WITH DIFFERENTIAL (CANCER CENTER ONLY)
Abs Immature Granulocytes: 0.02 K/uL (ref 0.00–0.07)
Basophils Absolute: 0.1 K/uL (ref 0.0–0.1)
Basophils Relative: 1 %
Eosinophils Absolute: 0.2 K/uL (ref 0.0–0.5)
Eosinophils Relative: 2 %
HCT: 36.2 % (ref 36.0–46.0)
Hemoglobin: 12.7 g/dL (ref 12.0–15.0)
Immature Granulocytes: 0 %
Lymphocytes Relative: 25 %
Lymphs Abs: 2.2 K/uL (ref 0.7–4.0)
MCH: 26.1 pg (ref 26.0–34.0)
MCHC: 35.1 g/dL (ref 30.0–36.0)
MCV: 74.5 fL — ABNORMAL LOW (ref 80.0–100.0)
Monocytes Absolute: 0.6 K/uL (ref 0.1–1.0)
Monocytes Relative: 6 %
Neutro Abs: 5.7 K/uL (ref 1.7–7.7)
Neutrophils Relative %: 66 %
Platelet Count: 276 K/uL (ref 150–400)
RBC: 4.86 MIL/uL (ref 3.87–5.11)
RDW: 18.5 % — ABNORMAL HIGH (ref 11.5–15.5)
WBC Count: 8.7 K/uL (ref 4.0–10.5)
nRBC: 0 % (ref 0.0–0.2)

## 2024-04-04 LAB — IRON AND IRON BINDING CAPACITY (CC-WL,HP ONLY)
Iron: 31 ug/dL (ref 28–170)
Saturation Ratios: 8 % — ABNORMAL LOW (ref 10.4–31.8)
TIBC: 371 ug/dL (ref 250–450)
UIBC: 340 ug/dL

## 2024-04-04 LAB — VITAMIN B12: Vitamin B-12: 381 pg/mL (ref 180–914)

## 2024-04-04 LAB — FERRITIN: Ferritin: 98 ng/mL (ref 11–307)

## 2024-04-06 ENCOUNTER — Ambulatory Visit: Payer: Self-pay | Admitting: Physician Assistant

## 2024-04-08 ENCOUNTER — Other Ambulatory Visit: Payer: Self-pay | Admitting: Physician Assistant

## 2024-04-08 DIAGNOSIS — D500 Iron deficiency anemia secondary to blood loss (chronic): Secondary | ICD-10-CM

## 2024-04-08 MED ORDER — FERROUS SULFATE 325 (65 FE) MG PO TBEC: 325.0000 mg | DELAYED_RELEASE_TABLET | Freq: Every day | ORAL | 3 refills | Status: AC

## 2024-05-03 ENCOUNTER — Inpatient Hospital Stay

## 2024-05-03 DIAGNOSIS — D509 Iron deficiency anemia, unspecified: Secondary | ICD-10-CM | POA: Diagnosis not present

## 2024-05-03 DIAGNOSIS — D500 Iron deficiency anemia secondary to blood loss (chronic): Secondary | ICD-10-CM

## 2024-05-03 LAB — CBC WITH DIFFERENTIAL (CANCER CENTER ONLY)
Abs Immature Granulocytes: 0.01 K/uL (ref 0.00–0.07)
Basophils Absolute: 0.1 K/uL (ref 0.0–0.1)
Basophils Relative: 1 %
Eosinophils Absolute: 0.2 K/uL (ref 0.0–0.5)
Eosinophils Relative: 2 %
HCT: 33.4 % — ABNORMAL LOW (ref 36.0–46.0)
Hemoglobin: 11.9 g/dL — ABNORMAL LOW (ref 12.0–15.0)
Immature Granulocytes: 0 %
Lymphocytes Relative: 33 %
Lymphs Abs: 2.7 K/uL (ref 0.7–4.0)
MCH: 26.6 pg (ref 26.0–34.0)
MCHC: 35.6 g/dL (ref 30.0–36.0)
MCV: 74.7 fL — ABNORMAL LOW (ref 80.0–100.0)
Monocytes Absolute: 0.5 K/uL (ref 0.1–1.0)
Monocytes Relative: 6 %
Neutro Abs: 4.8 K/uL (ref 1.7–7.7)
Neutrophils Relative %: 58 %
Platelet Count: 254 K/uL (ref 150–400)
RBC: 4.47 MIL/uL (ref 3.87–5.11)
RDW: 17.2 % — ABNORMAL HIGH (ref 11.5–15.5)
WBC Count: 8.2 K/uL (ref 4.0–10.5)
nRBC: 0 % (ref 0.0–0.2)

## 2024-05-03 LAB — CMP (CANCER CENTER ONLY)
ALT: 23 U/L (ref 0–44)
AST: 30 U/L (ref 15–41)
Albumin: 4 g/dL (ref 3.5–5.0)
Alkaline Phosphatase: 57 U/L (ref 38–126)
Anion gap: 7 (ref 5–15)
BUN: 12 mg/dL (ref 6–20)
CO2: 26 mmol/L (ref 22–32)
Calcium: 8.9 mg/dL (ref 8.9–10.3)
Chloride: 107 mmol/L (ref 98–111)
Creatinine: 0.84 mg/dL (ref 0.44–1.00)
GFR, Estimated: >60 mL/min
Glucose, Bld: 94 mg/dL (ref 70–99)
Potassium: 4.6 mmol/L (ref 3.5–5.1)
Sodium: 140 mmol/L (ref 135–145)
Total Bilirubin: <0.2 mg/dL (ref 0.0–1.2)
Total Protein: 7.3 g/dL (ref 6.5–8.1)

## 2024-05-03 LAB — IRON AND IRON BINDING CAPACITY (CC-WL,HP ONLY)
Iron: 22 ug/dL — ABNORMAL LOW (ref 28–170)
Saturation Ratios: 6 % — ABNORMAL LOW (ref 10.4–31.8)
TIBC: 372 ug/dL (ref 250–450)
UIBC: 350 ug/dL

## 2024-05-03 LAB — FERRITIN: Ferritin: 53 ng/mL (ref 11–307)

## 2024-05-04 ENCOUNTER — Ambulatory Visit: Payer: Self-pay | Admitting: Physician Assistant

## 2024-05-04 ENCOUNTER — Other Ambulatory Visit: Payer: Self-pay | Admitting: Physician Assistant

## 2024-05-06 NOTE — Telephone Encounter (Signed)
 New year eligibility check:    Auth Submission: NO AUTH NEEDED Site of care: [facility] Payer: [place] [employer] Medication & CPT/J Code(s) submitted: Monoferric  (Ferric derisomaltose ) [id number] Diagnosis Code: D50.0 Route of submission (phone, fax, portal):  Phone # Fax # Auth type: Buy/Bill HB Units/visits requested: 1000MG  X 1 DOSE Reference number:  Approval from: 05/06/24 to 07/03/24

## 2024-05-13 ENCOUNTER — Ambulatory Visit (HOSPITAL_COMMUNITY)
Admission: RE | Admit: 2024-05-13 | Discharge: 2024-05-13 | Disposition: A | Source: Ambulatory Visit | Attending: Physician Assistant

## 2024-05-13 VITALS — BP 134/92 | HR 105 | Temp 98.7°F | Resp 16

## 2024-05-13 DIAGNOSIS — D500 Iron deficiency anemia secondary to blood loss (chronic): Secondary | ICD-10-CM | POA: Diagnosis present

## 2024-05-13 MED ORDER — SODIUM CHLORIDE 0.9 % IV SOLN
1000.0000 mg | Freq: Once | INTRAVENOUS | Status: AC
  Administered 2024-05-13: 1000 mg via INTRAVENOUS
  Filled 2024-05-13: qty 1000

## 2024-07-05 ENCOUNTER — Inpatient Hospital Stay: Admitting: Physician Assistant

## 2024-07-05 ENCOUNTER — Inpatient Hospital Stay: Attending: Physician Assistant
